# Patient Record
Sex: Male | Born: 1975
Health system: Southern US, Community
[De-identification: ages and names within clinical notes are randomized; demographics above are authoritative.]

## PROBLEM LIST (undated history)

## (undated) DIAGNOSIS — J069 Acute upper respiratory infection, unspecified: Secondary | ICD-10-CM

---
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
## 2007-04-23 ENCOUNTER — Ambulatory Visit (HOSPITAL_BASED_OUTPATIENT_CLINIC_OR_DEPARTMENT_OTHER): Admission: RE | Admit: 2007-04-23 | Discharge: 2007-04-23 | Payer: Self-pay | Admitting: Orthopedic Surgery

## 2007-06-28 ENCOUNTER — Emergency Department (HOSPITAL_COMMUNITY): Admission: EM | Admit: 2007-06-28 | Discharge: 2007-06-28 | Payer: Self-pay | Admitting: Emergency Medicine

## 2010-07-31 NOTE — Op Note (Signed)
Carlos Bush, Carlos Bush               ACCOUNT NO.:  [id number]   MEDICAL RECORD NO.:  [id number]          PATIENT TYPE:  AMB   LOCATION:  DSC                          FACILITY:  [facility]   PHYSICIAN:  Loreta Ave, M.D. DATE OF BIRTH:  05-06-1975   DATE OF PROCEDURE:  04/23/2007  DATE OF DISCHARGE:                               OPERATIVE REPORT   PREOPERATIVE DIAGNOSIS:  Grade 3 plus acromioclavicular separation,  right shoulder closed.   POSTOPERATIVE DIAGNOSES:  1. Grade 3 plus acromioclavicular separation, right shoulder closed.  2. 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   PROCEDURE:  Open repair of acromioclavicular dislocation with a fiber  weave coracoclavicular suture.  FiberWire primary repair of  coracoclavicular ligament.  Excision, distal clavicle.   SURGEON:  Mckinley Jewel, MD.   ASSISTANT:  Zonia Kief, PA, present throughout the entire case.   ANESTHESIA:  General.   BLOOD LOSS:  Minimal.   SPECIMENS:  None.   COMPLICATIONS:  None.   DRESSING:  Soft compressive with shoulder immobilizer.   PROCEDURE:  The patient brought to the operating room, placed on the  operating table in supine position.  After adequate anesthesia had been  obtained, prepped and draped in usual sterile fashion.  A saber incision  over the end of the clavicle going down to was coracoid.  Skin  subcutaneous tissue divided.  The distal clavicle had pulled out and  ripped the deltopectoral fascia with marked superior migration.  The  fascia was opened longitudinally from the [facility] joint over the did lateral  third of the clavicle and generous soft tissue flaps maintain on either  side.  End then the clavicle had marked traumatic change from the  separation.  Utilizing blunt and sharp dissection the, coracoclavicular  interval was exposed.  The coracoid exposed and then utilizing a right  angle clamp and then the Concept suture passing device, A Fiberweave  suture was  passed underneath the coracoid brought up.  I then placed a  mattress suture into coracoclavicular with FiberWire suture.  Lateral  centimeter of clavicle exposed and removed to prevent postoperative  symptoms of the [facility] joint.  A drill hole was then made at clavicle top to  bottom right over the coracoid.  The Fiberweave suture was placed  through drill hole.  The arm was then brought up to reduce the clavicle  to its normal position.  The Fiberweave suture was then firmly tied down  in place, restoring normal relationship between clavicle and coracoid.  The FiberWire suture, which brought the ligament fragments back  together, was then firmly tied to reapposed the coracoclavicular  ligament in good position for healing.  Both were cut.   Overall appearance and reduction confirmed visually and  fluoroscopically.  Wound irrigated.  I then closed the deltopectoral  fascia over top of the repair drill hole and reconstruction.  Wound  irrigated.  The skin closed with subcutaneous subcuticular Vicryl and  Steri-Strips.  Sterile compressive dressing was applied.  Shoulder  mobilizer applied.  Anesthesia reversed.  Brought to the recovery room.  Tolerated surgery  well without complication.      Loreta Ave, M.D.  Electronically Signed     DFM/MEDQ  D:  04/24/2007  T:  04/26/2007  Job:  [id number]

## 2011-03-15 ENCOUNTER — Encounter (INDEPENDENT_AMBULATORY_CARE_PROVIDER_SITE_OTHER): Payer: PRIVATE HEALTH INSURANCE | Admitting: Family Medicine

## 2011-03-15 DIAGNOSIS — Z0000 Encounter for general adult medical examination without abnormal findings: Secondary | ICD-10-CM

## 2013-01-12 ENCOUNTER — Ambulatory Visit (INDEPENDENT_AMBULATORY_CARE_PROVIDER_SITE_OTHER): Payer: Federal, State, Local not specified - PPO | Admitting: Emergency Medicine

## 2013-01-12 VITALS — BP 120/82 | HR 72 | Temp 98.9°F | Resp 18 | Ht 69.5 in | Wt 180.2 lb

## 2013-01-12 DIAGNOSIS — R51 Headache: Secondary | ICD-10-CM

## 2013-01-12 MED ORDER — MELOXICAM 15 MG PO TABS: 15.0000 mg | ORAL_TABLET | Freq: Every day | ORAL | Status: DC

## 2013-01-12 NOTE — Progress Notes (Signed)
  Subjective:    Patient ID: Carlos Bush, male    DOB: 01-30-1976, 37 y.o.   MRN: [id number]  HPI Has had headache for last week. Has had pressure behind his eyes and through head, also on top. Taking sudafed 12 hour and other daytime cold relief with little relief. Took sudafed and two advil this morning- some relief, not sure if meds or getting up and getting moving. Yesterday and today worsening pain. Wakes up during night with head pounding. Headache comes and goes Little runny nose, no congestion, no sore throat currently, last week had 1-2 days of sore throat. No ear pain. When flexing head forward, feels sharp pain down back into legs, lasts while head flexed. Gets better as day goes on. Fever to 99.9 and chills 6 days ago. No cough, no SOB, no CP. No visual changes, no light sensitivity. No nausea or vomiting.  Headache worse in morning and late afternoon. By 10 am, feels good, worse around 5 pm.  Review of Systems  Constitutional: Positive for fever and chills. Negative for activity change, appetite change and fatigue.  HENT: Positive for sinus pressure and sore throat. Negative for congestion, ear pain, postnasal drip and rhinorrhea.   Eyes: Negative for photophobia, pain and visual disturbance.  Respiratory: Negative for cough, chest tightness and shortness of breath.   Cardiovascular: Negative for chest pain.  Allergic/Immunologic: Negative for environmental allergies.  Neurological: Positive for headaches.       Objective:   Physical Exam  Nursing note and vitals reviewed. Constitutional: He is oriented to person, place, and time. He appears well-developed and well-nourished.  HENT:  Right Ear: Tympanic membrane, external ear and ear canal normal.  Left Ear: Tympanic membrane, external ear and ear canal normal.  Nose: Nose normal. No mucosal edema or rhinorrhea. Right sinus exhibits no maxillary sinus tenderness and no frontal sinus tenderness. Left sinus exhibits no  maxillary sinus tenderness and no frontal sinus tenderness.  Mouth/Throat: Oropharynx is clear and moist. No oropharyngeal exudate, posterior oropharyngeal edema, posterior oropharyngeal erythema or tonsillar abscesses.  Eyes: Conjunctivae are normal. Pupils are equal, round, and reactive to light. Right eye exhibits no discharge. Left eye exhibits no discharge.  Neck: Normal range of motion. Neck supple. No thyromegaly present.  Cardiovascular: Normal rate, regular rhythm and normal heart sounds.   Pulmonary/Chest: Effort normal and breath sounds normal.  Musculoskeletal: Normal range of motion. He exhibits no edema and no tenderness.  Lymphadenopathy:    He has no cervical adenopathy.  Neurological: He is alert and oriented to person, place, and time. He has normal reflexes. No cranial nerve deficit.  Skin: Skin is warm and dry.  Psychiatric: He has a normal mood and affect. His behavior is normal. Judgment and thought content normal.       Assessment & Plan:  Headache(784.0)  Meds ordered this encounter  Medications  . meloxicam (MOBIC) 15 MG tablet    Sig: Take 1 tablet (15 mg total) by mouth daily.    Dispense:  14 tablet    Refill:  0   1- Continue sudafed and add meloxicam 15 mg po qd. 2- RTC if worsening symptoms, visual changes, fever, vomiting or no improvement in 5-7 days.

## 2013-04-14 ENCOUNTER — Ambulatory Visit (INDEPENDENT_AMBULATORY_CARE_PROVIDER_SITE_OTHER): Payer: Federal, State, Local not specified - PPO | Admitting: Physician Assistant

## 2013-04-14 VITALS — BP 122/78 | HR 74 | Temp 98.0°F | Resp 17 | Ht 69.5 in | Wt 178.0 lb

## 2013-04-14 DIAGNOSIS — R059 Cough, unspecified: Secondary | ICD-10-CM

## 2013-04-14 DIAGNOSIS — J209 Acute bronchitis, unspecified: Secondary | ICD-10-CM

## 2013-04-14 DIAGNOSIS — R0602 Shortness of breath: Secondary | ICD-10-CM

## 2013-04-14 DIAGNOSIS — R05 Cough: Secondary | ICD-10-CM

## 2013-04-14 MED ORDER — METHYLPREDNISOLONE (PAK) 4 MG PO TABS: ORAL_TABLET | ORAL | Status: DC

## 2013-04-14 MED ORDER — HYDROCOD POLST-CHLORPHEN POLST 10-8 MG/5ML PO LQCR: 5.0000 mL | Freq: Two times a day (BID) | ORAL | Status: DC | PRN

## 2013-04-14 MED ORDER — AZITHROMYCIN 250 MG PO TABS: ORAL_TABLET | ORAL | Status: DC

## 2013-04-14 NOTE — Progress Notes (Signed)
   Subjective:    Patient ID: Carlos Bush, male    DOB: 07/04/1975, 38 y.o.   MRN: [id number]  HPI 38 year old male presents for evaluation of cough x 4 weeks. States symptoms started suddenly and continue to "linger on."  Admits to a 677 month old daughter and wife that are sick at home with similar symptoms. Daughter was diagnosed with RSV last week.  He does admit that he is feeling better but does continue to cough especially in the morning. It is productive of whitish/yellow mucous.    Denies fever, chills, nausea, vomiting, chest pain, headache, sore throat, wheezing, or otalgia. Does have slight nasal congestion and sinus drainage.    He has been taking OTC advil cold and flu which does seem to be helping.    Patient is otherwise healthy with no other concerns today.     Review of Systems  Constitutional: Negative for fever and chills.  HENT: Positive for congestion, postnasal drip and sinus pressure. Negative for ear pain and sore throat.   Respiratory: Positive for cough and shortness of breath. Negative for chest tightness and wheezing.   Gastrointestinal: Negative for nausea, vomiting and abdominal pain.  Neurological: Negative for dizziness and headaches.       Objective:   Physical Exam  Constitutional: He is oriented to person, place, and time. He appears well-developed and well-nourished.  HENT:  Head: Normocephalic and atraumatic.  Right Ear: Hearing, tympanic membrane, external ear and ear canal normal.  Left Ear: Hearing, tympanic membrane, external ear and ear canal normal.  Mouth/Throat: Uvula is midline, oropharynx is clear and moist and mucous membranes are normal.  Eyes: Conjunctivae are normal.  Neck: Normal range of motion. Neck supple.  Cardiovascular: Normal rate, regular rhythm and normal heart sounds.   Pulmonary/Chest: Effort normal and breath sounds normal.  Neurological: He is alert and oriented to person, place, and time.  Psychiatric: He has a  normal mood and affect. His behavior is normal. Judgment and thought content normal.          Assessment & Plan:  Acute bronchitis - Plan: azithromycin (ZITHROMAX) 250 MG tablet  Cough - Plan: chlorpheniramine-HYDROcodone (TUSSIONEX PENNKINETIC ER) 10-8 MG/5ML LQCR  Shortness of breath - Plan: methylPREDNIsolone (MEDROL DOSPACK) 4 MG tablet  Patient is afebrile and nontoxic appearing. Due to length of illness will cover with Zpack and medrol dose pack. Tussionex qhs prn cough. Follow up if symptoms worsen or fail to improve.

## 2013-09-13 ENCOUNTER — Ambulatory Visit (INDEPENDENT_AMBULATORY_CARE_PROVIDER_SITE_OTHER): Payer: Federal, State, Local not specified - PPO | Admitting: Internal Medicine

## 2013-09-13 VITALS — BP 116/76 | HR 56 | Temp 97.8°F | Resp 16 | Ht 69.5 in | Wt 180.4 lb

## 2013-09-13 DIAGNOSIS — S0501XA Injury of conjunctiva and corneal abrasion without foreign body, right eye, initial encounter: Secondary | ICD-10-CM

## 2013-09-13 DIAGNOSIS — H00019 Hordeolum externum unspecified eye, unspecified eyelid: Secondary | ICD-10-CM

## 2013-09-13 DIAGNOSIS — H5213 Myopia, bilateral: Secondary | ICD-10-CM

## 2013-09-13 DIAGNOSIS — H00013 Hordeolum externum right eye, unspecified eyelid: Secondary | ICD-10-CM

## 2013-09-13 DIAGNOSIS — S058X9A Other injuries of unspecified eye and orbit, initial encounter: Secondary | ICD-10-CM

## 2013-09-13 DIAGNOSIS — H5210 Myopia, unspecified eye: Secondary | ICD-10-CM

## 2013-09-13 MED ORDER — OFLOXACIN 0.3 % OP SOLN: OPHTHALMIC | Status: DC

## 2013-09-13 NOTE — Progress Notes (Signed)
   Subjective:    Patient ID: Carlos Bush, male    DOB: 11/06/1975, 38 y.o.   MRN: [id number] This chart was scribed for Ellamae Siaobert Doolittle, MD by Evon Slackerrance Branch, ED Scribe. This Patient was seen in room 10 and the patients care was started at 8:10 PM  HPI Carlos Bush is a 38 y.o. male Present with right eye itching onset this morning while taking a shower. He states he has associated eye drainage, and photophobia. He states it feels like there is something in his eye that he cant get out. He states that he does wear contacts. He denies having any visual disturbance or other related symptoms.   No ongoing problems/no medications Review of Systems  Eyes: Positive for photophobia, discharge and itching. Negative for visual disturbance.   he has no rheumatic illnesses    Objective:    Physical Exam  Nursing note and vitals reviewed. Constitutional: He is oriented to person, place, and time. He appears well-developed and well-nourished. No distress.  HENT:  Head: Normocephalic and atraumatic.  Nose: Nose normal.  Mouth/Throat: Oropharynx is clear and moist.  Eyes: Conjunctivae and EOM are normal. Pupils are equal, round, and reactive to light. Right eye exhibits discharge.    This blushing is in the distribution of the contact. The upper lid was puffy and slightly tender just lateral to the midline without a distinct nodule. This lid is everted and reveals a pus discharge there is slightly particulate in the area of his tenderness and this is swabbed and removed. There is no lid cellulitis and no corneal ulcer.  Neck: Neck supple.  Cardiovascular: Normal rate.   Pulmonary/Chest: Effort normal.  Lymphadenopathy:    He has no cervical adenopathy.  Neurological: He is alert and oriented to person, place, and time.    Assessment & Plan:  Cornea abrasion, right, initial encounter  Stye, right?--resolving  Myopia, bilateral w/ contacts  Meds ordered this encounter  Medications   . ofloxacin (OCUFLOX) 0.3 % ophthalmic solution    Sig: 2 qtts R Eye q 2h til bedtime then q4h while awake for next 4 days    Dispense:  5 mL    Refill:  1   If not improved in 24-48 hours we'll need slit lamp exam Avoid contacts until well     I have completed the patient encounter in its entirety as documented by the scribe, with editing by me where necessary. Robert P. Merla Richesoolittle, M.D.

## 2013-09-14 DIAGNOSIS — H5210 Myopia, unspecified eye: Secondary | ICD-10-CM | POA: Insufficient documentation

## 2013-09-28 ENCOUNTER — Ambulatory Visit (INDEPENDENT_AMBULATORY_CARE_PROVIDER_SITE_OTHER): Payer: Federal, State, Local not specified - PPO | Admitting: Family Medicine

## 2013-09-28 VITALS — BP 110/68 | HR 62 | Temp 97.9°F | Resp 16 | Ht 69.75 in | Wt 180.0 lb

## 2013-09-28 DIAGNOSIS — R591 Generalized enlarged lymph nodes: Secondary | ICD-10-CM

## 2013-09-28 DIAGNOSIS — J029 Acute pharyngitis, unspecified: Secondary | ICD-10-CM

## 2013-09-28 DIAGNOSIS — R599 Enlarged lymph nodes, unspecified: Secondary | ICD-10-CM

## 2013-09-28 LAB — POCT RAPID STREP A (OFFICE): RAPID STREP A SCREEN: NEGATIVE

## 2013-09-28 MED ORDER — AMOXICILLIN 500 MG PO CAPS: 500.0000 mg | ORAL_CAPSULE | Freq: Three times a day (TID) | ORAL | Status: DC

## 2013-09-28 NOTE — Patient Instructions (Addendum)
Tylenol or ibuprofen as needed for sore throat. Other info as below. If throat culture negative, we have the option of discontinuing the antibiotic at that time.  We will let you know those results in next 4-5 days. Return to the clinic or go to the nearest emergency room if any of your symptoms worsen or new symptoms occur.   Sore Throat A sore throat is pain, burning, irritation, or scratchiness of the throat. There is often pain or tenderness when swallowing or talking. A sore throat may be accompanied by other symptoms, such as coughing, sneezing, fever, and swollen neck glands. A sore throat is often the first sign of another sickness, such as a cold, flu, strep throat, or mononucleosis (commonly known as mono). Most sore throats go away without medical treatment. CAUSES  The most common causes of a sore throat include:  A viral infection, such as a cold, flu, or mono.  A bacterial infection, such as strep throat, tonsillitis, or whooping cough.  Seasonal allergies.  Dryness in the air.  Irritants, such as smoke or pollution.  Gastroesophageal reflux disease (GERD). HOME CARE INSTRUCTIONS   Only take over-the-counter medicines as directed by your caregiver.  Drink enough fluids to keep your urine clear or pale yellow.  Rest as needed.  Try using throat sprays, lozenges, or sucking on hard candy to ease any pain (if older than 4 years or as directed).  Sip warm liquids, such as broth, herbal tea, or warm water with honey to relieve pain temporarily. You may also eat or drink cold or frozen liquids such as frozen ice pops.  Gargle with salt water (mix 1 tsp salt with 8 oz of water).  Do not smoke and avoid secondhand smoke.  Put a cool-mist humidifier in your bedroom at night to moisten the air. You can also turn on a hot shower and sit in the bathroom with the door closed for 5-10 minutes. SEEK IMMEDIATE MEDICAL CARE IF:  You have difficulty breathing.  You are unable to  swallow fluids, soft foods, or your saliva.  You have increased swelling in the throat.  Your sore throat does not get better in 7 days.  You have nausea and vomiting.  You have a fever or persistent symptoms for more than 2-3 days.  You have a fever and your symptoms suddenly get worse. MAKE SURE YOU:   Understand these instructions.  Will watch your condition.  Will get help right away if you are not doing well or get worse. Document Released: 04/11/2004 Document Revised: 02/19/2012 Document Reviewed: 11/10/2011 [facility] Patient Information 2015 [place]ExitCare, [place]. This information is not intended to replace advice given to you by your health care provider. Make sure you discuss any questions you have with your health care provider.

## 2013-09-28 NOTE — Progress Notes (Addendum)
Subjective:  This chart was scribed for Carlos Staggers, MD, by Carlos Bush, Medical Scribe. This  patient's care was started at 9:25 AM.  Authored by Carlos Bush. MD - unable to change in [facility].   Patient ID: [name], male    DOB: 05-12-1975, 38 y.o.   MRN: [id number]  Sore Throat  Associated symptoms include congestion.  Sinus Problem Associated symptoms include congestion, sinus pressure and a sore throat.    [name] is a 38 y.o. male  PCP: No PCP Per Patient  The pt reports a gradually-worsening sore throat which began three days ago. He also reports swollen lymph nodes and a week and a half of sinus congestion, drainage, and pressure. [name] states the sore throat does not seem associated with sinus symptoms. He denies a fever or appetite changes. He has used Nyquil and Advil Cold and Sinus without resolution. His daughter experienced one day of emesis and fever which resolved spontaneously.  He denies known medication allergies.   Patient Active Problem List   Diagnosis Date Noted   Myopia-contacts 09/14/2013   History reviewed. No pertinent past medical history.  History reviewed. No pertinent past surgical history.  No Known Allergies  Prior to Admission medications   Not on File    Review of Systems  Constitutional: Negative for fever and activity change.  HENT: Positive for congestion, rhinorrhea, sinus pressure and sore throat.        Objective:   Physical Exam  Nursing note and vitals reviewed. Constitutional: He is oriented to person, place, and time. He appears well-developed and well-nourished. No distress.  HENT:  Head: Normocephalic and atraumatic.  Right Ear: Tympanic membrane, external ear and ear canal normal.  Left Ear: Tympanic membrane, external ear and ear canal normal.  Nose: No rhinorrhea.  Mouth/Throat: Mucous membranes are normal. Posterior oropharyngeal erythema present. No oropharyngeal exudate or tonsillar abscesses.   Erythema diffusely to oropharynx.   Eyes: Conjunctivae and EOM are normal. Pupils are equal, round, and reactive to light.  Neck: Neck supple. No tracheal deviation present.  Cardiovascular: Normal rate, regular rhythm, normal heart sounds and intact distal pulses.   No murmur heard. Pulmonary/Chest: Effort normal and breath sounds normal. No respiratory distress. He has no wheezes. He has no rhonchi. He has no rales.  Abdominal: Soft. There is no tenderness.  Musculoskeletal: Normal range of motion.  Lymphadenopathy:    He has no cervical adenopathy.  Right greater than left, slightly enlarged AC node tender to palpation.   Neurological: He is alert and oriented to person, place, and time.  Skin: Skin is warm and dry. No rash noted.  Psychiatric: He has a normal mood and affect. His behavior is normal.    Filed Vitals:   09/28/13 0857  BP: 110/68  Pulse: 62  Temp: 97.9 F (36.6 C)  TempSrc: Oral  Resp: 16  Height: 5' 9.75" (1.772 m)  Weight: 180 lb (81.647 kg)  SpO2: 100%   Results for orders placed in visit on 09/28/13  POCT RAPID STREP A (OFFICE)      Result Value Ref Range   Rapid Strep A Screen Negative  Negative      Assessment & Plan:   Carlos Bush is a 38 y.o. male Sore throat - Plan: POCT rapid strep A, Culture, Group A Strep, amoxicillin (AMOXIL) 500 MG capsule  Lymphadenopathy - Plan: amoxicillin (AMOXIL) 500 MG capsule  Prior URI improving, suspected secondary infection/new cause of sore throat,  and with LAD and appearance - possible false negative RS.  Start amox, check cx - option to d/c abx if negative, and sx care. rtc precautions.   Meds ordered this encounter  Medications   amoxicillin (AMOXIL) 500 MG capsule    Sig: Take 1 capsule (500 mg total) by mouth 3 (three) times daily.    Dispense:  30 capsule    Refill:  0   Patient Instructions  Tylenol or ibuprofen as needed for sore throat. Other info as below. If throat culture negative, we  have the option of discontinuing the antibiotic at that time.  We will let you know those results in next 4-5 days. Return to the clinic or go to the nearest emergency room if any of your symptoms worsen or new symptoms occur.   Sore Throat A sore throat is pain, burning, irritation, or scratchiness of the throat. There is often pain or tenderness when swallowing or talking. A sore throat may be accompanied by other symptoms, such as coughing, sneezing, fever, and swollen neck glands. A sore throat is often the first sign of another sickness, such as a cold, flu, strep throat, or mononucleosis (commonly known as mono). Most sore throats go away without medical treatment. CAUSES  The most common causes of a sore throat include:  A viral infection, such as a cold, flu, or mono.  A bacterial infection, such as strep throat, tonsillitis, or whooping cough.  Seasonal allergies.  Dryness in the air.  Irritants, such as smoke or pollution.  Gastroesophageal reflux disease (GERD). HOME CARE INSTRUCTIONS   Only take over-the-counter medicines as directed by your caregiver.  Drink enough fluids to keep your urine clear or pale yellow.  Rest as needed.  Try using throat sprays, lozenges, or sucking on hard candy to ease any pain (if older than 4 years or as directed).  Sip warm liquids, such as broth, herbal tea, or warm water with honey to relieve pain temporarily. You may also eat or drink cold or frozen liquids such as frozen ice pops.  Gargle with salt water (mix 1 tsp salt with 8 oz of water).  Do not smoke and avoid secondhand smoke.  Put a cool-mist humidifier in your bedroom at night to moisten the air. You can also turn on a hot shower and sit in the bathroom with the door closed for 5-10 minutes. SEEK IMMEDIATE MEDICAL CARE IF:  You have difficulty breathing.  You are unable to swallow fluids, soft foods, or your saliva.  You have increased swelling in the throat.  Your  sore throat does not get better in 7 days.  You have nausea and vomiting.  You have a fever or persistent symptoms for more than 2-3 days.  You have a fever and your symptoms suddenly get worse. MAKE SURE YOU:   Understand these instructions.  Will watch your condition.  Will get help right away if you are not doing well or get worse. Document Released: 04/11/2004 Document Revised: 02/19/2012 Document Reviewed: 11/10/2011 [facility] LPExitCare Patient Information 2015 [place]ExitCare, [place]LLC. This information is not intended to replace advice given to you by your health care provider. Make sure you discuss any questions you have with your health care provider.       I personally performed the services described in this documentation, which was scribed in my presence. The recorded information has been reviewed and considered, and addended by me as needed.

## 2013-09-29 LAB — CULTURE, GROUP A STREP

## 2014-05-21 ENCOUNTER — Ambulatory Visit (INDEPENDENT_AMBULATORY_CARE_PROVIDER_SITE_OTHER): Payer: Federal, State, Local not specified - PPO | Admitting: Physician Assistant

## 2014-05-21 VITALS — BP 114/66 | HR 77 | Temp 98.6°F | Resp 19 | Ht 69.5 in | Wt 178.6 lb

## 2014-05-21 DIAGNOSIS — J069 Acute upper respiratory infection, unspecified: Secondary | ICD-10-CM | POA: Diagnosis not present

## 2014-05-21 MED ORDER — BENZONATATE 100 MG PO CAPS: 100.0000 mg | ORAL_CAPSULE | Freq: Three times a day (TID) | ORAL | Status: DC | PRN

## 2014-05-21 MED ORDER — HYDROCOD POLST-CHLORPHEN POLST 10-8 MG/5ML PO LQCR: 5.0000 mL | Freq: Two times a day (BID) | ORAL | Status: DC | PRN

## 2014-05-21 NOTE — Progress Notes (Signed)
   Subjective:    Patient ID: Carlos Bush, male    DOB: 08/30/1975, 39 y.o.   MRN: [id number]  HPI Patient presents for 2 weeks of cough and congestion. Initial sx were productive cough, fatigue, rhinorrhea, sore throat, and headache which have improved. Cough, however, has continue and coughing spells make it difficult to breath. Denies sick contacts. No h/o asthma or allergies. Has tried alkaseltzer cold and mucinex with some relief.   Review of Systems  Constitutional: Negative for fever, chills, diaphoresis and fatigue.  HENT: Positive for congestion and rhinorrhea. Negative for ear discharge, ear pain, postnasal drip, sinus pressure and sore throat.   Respiratory: Positive for cough. Negative for shortness of breath and wheezing.   Cardiovascular: Negative for chest pain.  Gastrointestinal: Negative for nausea and vomiting.  Neurological: Negative for dizziness and headaches.       Objective:   Physical Exam  Constitutional: He is oriented to person, place, and time. He appears well-developed and well-nourished. No distress.  Blood pressure 114/66, pulse 77, temperature 98.6 F (37 C), temperature source Oral, resp. rate 19, height 5' 9.5" (1.765 m), weight 178 lb 9.6 oz (81.012 kg), SpO2 98 %.  HENT:  Head: Normocephalic and atraumatic.  Right Ear: Tympanic membrane, external ear and ear canal normal.  Left Ear: Tympanic membrane, external ear and ear canal normal.  Nose: Rhinorrhea present. No mucosal edema. Right sinus exhibits no maxillary sinus tenderness and no frontal sinus tenderness. Left sinus exhibits no maxillary sinus tenderness and no frontal sinus tenderness.  Mouth/Throat: Uvula is midline and mucous membranes are normal. Posterior oropharyngeal erythema (marginal) present. No oropharyngeal exudate, posterior oropharyngeal edema or tonsillar abscesses.  Eyes: Conjunctivae are normal. Pupils are equal, round, and reactive to light. Right eye exhibits no discharge.  Left eye exhibits no discharge. No scleral icterus.  Neck: Normal range of motion. Neck supple.  Cardiovascular: Normal rate, regular rhythm and normal heart sounds.  Exam reveals no gallop and no friction rub.   No murmur heard. Pulmonary/Chest: Effort normal and breath sounds normal. No respiratory distress. He has no decreased breath sounds. He has no wheezes. He has no rhonchi. He has no rales.  Lymphadenopathy:    He has no cervical adenopathy.  Neurological: He is alert and oriented to person, place, and time.  Skin: Skin is warm and dry. No rash noted. He is not diaphoretic. No erythema. No pallor.       Assessment & Plan:  1. Acute upper respiratory infection Plenty of fluid and rest. If no improvement by middle next week should call back and will send Zpack. - chlorpheniramine-HYDROcodone (TUSSIONEX PENNKINETIC ER) 10-8 MG/5ML LQCR; Take 5 mLs by mouth every 12 (twelve) hours as needed for cough (cough).  Dispense: 60 mL; Refill: 0 - benzonatate (TESSALON) 100 MG capsule; Take 1-2 capsules (100-200 mg total) by mouth 3 (three) times daily as needed for cough.  Dispense: 40 capsule; Refill: 0   Tysheem Accardo PA-C  [facility] 05/21/2014 3:06 PM

## 2014-05-21 NOTE — Patient Instructions (Signed)
Upper Respiratory Infection, Adult An upper respiratory infection (URI) is also sometimes known as the common cold. The upper respiratory tract includes the nose, sinuses, throat, trachea, and bronchi. Bronchi are the airways leading to the lungs. Most people improve within 1 week, but symptoms can last up to 2 weeks. A residual cough may last even longer.  CAUSES Many different viruses can infect the tissues lining the upper respiratory tract. The tissues become irritated and inflamed and often become very moist. Mucus production is also common. A cold is contagious. You can easily spread the virus to others by oral contact. This includes kissing, sharing a glass, coughing, or sneezing. Touching your mouth or nose and then touching a surface, which is then touched by another person, can also spread the virus. SYMPTOMS  Symptoms typically develop 1 to 3 days after you come in contact with a cold virus. Symptoms vary from person to person. They may include:  Runny nose.  Sneezing.  Nasal congestion.  Sinus irritation.  Sore throat.  Loss of voice (laryngitis).  Cough.  Fatigue.  Muscle aches.  Loss of appetite.  Headache.  Low-grade fever. DIAGNOSIS  You might diagnose your own cold based on familiar symptoms, since most people get a cold 2 to 3 times a year. Your caregiver can confirm this based on your exam. Most importantly, your caregiver can check that your symptoms are not due to another disease such as strep throat, sinusitis, pneumonia, asthma, or epiglottitis. Blood tests, throat tests, and X-rays are not necessary to diagnose a common cold, but they may sometimes be helpful in excluding other more serious diseases. Your caregiver will decide if any further tests are required. RISKS AND COMPLICATIONS  You may be at risk for a more severe case of the common cold if you smoke cigarettes, have chronic heart disease (such as heart failure) or lung disease (such as asthma), or if  you have a weakened immune system. The very young and very old are also at risk for more serious infections. Bacterial sinusitis, middle ear infections, and bacterial pneumonia can complicate the common cold. The common cold can worsen asthma and chronic obstructive pulmonary disease (COPD). Sometimes, these complications can require emergency medical care and may be life-threatening. PREVENTION  The best way to protect against getting a cold is to practice good hygiene. Avoid oral or hand contact with people with cold symptoms. Wash your hands often if contact occurs. There is no clear evidence that vitamin C, vitamin E, echinacea, or exercise reduces the chance of developing a cold. However, it is always recommended to get plenty of rest and practice good nutrition. TREATMENT  Treatment is directed at relieving symptoms. There is no cure. Antibiotics are not effective, because the infection is caused by a virus, not by bacteria. Treatment may include:  Increased fluid intake. Sports drinks offer valuable electrolytes, sugars, and fluids.  Breathing heated mist or steam (vaporizer or shower).  Eating chicken soup or other clear broths, and maintaining good nutrition.  Getting plenty of rest.  Using gargles or lozenges for comfort.  Controlling fevers with ibuprofen or acetaminophen as directed by your caregiver.  Increasing usage of your inhaler if you have asthma. Zinc gel and zinc lozenges, taken in the first 24 hours of the common cold, can shorten the duration and lessen the severity of symptoms. Pain medicines may help with fever, muscle aches, and throat pain. A variety of non-prescription medicines are available to treat congestion and runny nose. Your caregiver   can make recommendations and may suggest nasal or lung inhalers for other symptoms.  HOME CARE INSTRUCTIONS   Only take over-the-counter or prescription medicines for pain, discomfort, or fever as directed by your  caregiver.  Use a warm mist humidifier or inhale steam from a shower to increase air moisture. This may keep secretions moist and make it easier to breathe.  Drink enough water and fluids to keep your urine clear or pale yellow.  Rest as needed.  Return to work when your temperature has returned to normal or as your caregiver advises. You may need to stay home longer to avoid infecting others. You can also use a face mask and careful hand washing to prevent spread of the virus. SEEK MEDICAL CARE IF:   After the first few days, you feel you are getting worse rather than better.  You need your caregiver's advice about medicines to control symptoms.  You develop chills, worsening shortness of breath, or brown or red sputum. These may be signs of pneumonia.  You develop yellow or brown nasal discharge or pain in the face, especially when you bend forward. These may be signs of sinusitis.  You develop a fever, swollen neck glands, pain with swallowing, or white areas in the back of your throat. These may be signs of strep throat. SEEK IMMEDIATE MEDICAL CARE IF:   You have a fever.  You develop severe or persistent headache, ear pain, sinus pain, or chest pain.  You develop wheezing, a prolonged cough, cough up blood, or have a change in your usual mucus (if you have chronic lung disease).  You develop sore muscles or a stiff neck. Document Released: 08/28/2000 Document Revised: 05/27/2011 Document Reviewed: 06/09/2013 Exit[employer] Patient Information 2015 [employer]. This information is not intended to replace advice given to you by your health care provider. Make sure you discuss any questions you have with your health care provider.  

## 2014-07-14 ENCOUNTER — Ambulatory Visit (INDEPENDENT_AMBULATORY_CARE_PROVIDER_SITE_OTHER): Payer: Federal, State, Local not specified - PPO | Admitting: Physician Assistant

## 2014-07-14 VITALS — BP 110/78 | HR 88 | Temp 97.7°F | Resp 20 | Ht 69.5 in | Wt 177.6 lb

## 2014-07-14 DIAGNOSIS — J069 Acute upper respiratory infection, unspecified: Secondary | ICD-10-CM | POA: Diagnosis not present

## 2014-07-14 DIAGNOSIS — T485X1A Poisoning by other anti-common-cold drugs, accidental (unintentional), initial encounter: Secondary | ICD-10-CM

## 2014-07-14 DIAGNOSIS — J310 Chronic rhinitis: Secondary | ICD-10-CM

## 2014-07-14 DIAGNOSIS — T485X5A Adverse effect of other anti-common-cold drugs, initial encounter: Secondary | ICD-10-CM

## 2014-07-14 MED ORDER — NAPROXEN 500 MG PO TABS: 500.0000 mg | ORAL_TABLET | Freq: Two times a day (BID) | ORAL | Status: DC

## 2014-07-14 MED ORDER — HYDROCODONE-HOMATROPINE 5-1.5 MG/5ML PO SYRP: 5.0000 mL | ORAL_SOLUTION | Freq: Three times a day (TID) | ORAL | Status: DC | PRN

## 2014-07-14 MED ORDER — LORATADINE-PSEUDOEPHEDRINE ER 10-240 MG PO TB24: 1.0000 | ORAL_TABLET | Freq: Every day | ORAL | Status: DC

## 2014-07-14 NOTE — Progress Notes (Signed)
07/14/2014 at 6:58 PM  Carlos Bush / DOB: 02/21/1976 / MRN: [id number]  The patient has Myopia-contacts on his problem list.  SUBJECTIVE  Chief complaint: Sinus Problem; Nasal Congestion; Headache; Fatigue; Chills; and Generalized Body Aches  Carlos Bush is a 39 y.o. male complaining of nasal blockage, post nasal drip, sinus and nasal congestion and sore throat that started 6 days ago.  Associated symptoms include headache today, and he denies fever, cough, difficulty breathing and jaw pain.The patient symptoms are worsening. Treatments tried thus far include Afrin, starting bid four days ago with good  relief initally. He has also tried Zyrtec D and ibuprofen both of which help a good deal.  He reports sick contacts.   He  has no past medical history on file.    Medications reviewed and updated by myself where necessary, and exist elsewhere in the encounter.   Carlos Bush has No Known Allergies. He  reports that he has never smoked. He does not have any smokeless tobacco history on file. He reports that he does not drink alcohol or use illicit drugs. He  reports that he currently engages in sexual activity. The patient  has no past surgical history on file.  His family history includes Cirrhosis in his father.  ROS  Per HPI  OBJECTIVE  His  height is 5' 9.5" (1.765 m) and weight is 177 lb 9.6 oz (80.559 kg). His oral temperature is 97.7 F (36.5 C). His blood pressure is 110/78 and his pulse is 88. His respiration is 20 and oxygen saturation is 98%.  The patient's body mass index is 25.86 kg/(m^2).  Physical Exam  Constitutional: He is oriented to person, place, and time. He appears well-developed and well-nourished. No distress.  HENT:  Right Ear: Hearing, tympanic membrane, external ear and ear canal normal.  Left Ear: Hearing, tympanic membrane, external ear and ear canal normal.  Nose: Mucosal edema present. No sinus tenderness. Right sinus exhibits no maxillary sinus  tenderness and no frontal sinus tenderness. Left sinus exhibits no maxillary sinus tenderness and no frontal sinus tenderness.  Mouth/Throat: Uvula is midline and mucous membranes are normal. Mucous membranes are not pale, not dry and not cyanotic. Posterior oropharyngeal erythema present. No oropharyngeal exudate, posterior oropharyngeal edema or tonsillar abscesses.  Cardiovascular: Normal rate and regular rhythm.   Respiratory: Effort normal and breath sounds normal. He has no wheezes. He has no rales.  Musculoskeletal: Normal range of motion.  Lymphadenopathy:    He has no cervical adenopathy.  Neurological: He is alert and oriented to person, place, and time.  Skin: Skin is warm and dry. He is not diaphoretic.  Psychiatric: He has a normal mood and affect.    No results found for this or any previous visit (from the past 24 hour(s)).  ASSESSMENT & PLAN  Gerri SporeWesley was seen today for sinus problem, nasal congestion, headache, fatigue, chills and generalized body aches.  Diagnoses and all orders for this visit:  Viral URI Orders: -     loratadine-pseudoephedrine (CLARITIN-D 24 HOUR) 10-240 MG per 24 hr tablet; Take 1 tablet by mouth daily. -     naproxen (NAPROSYN) 500 MG tablet; Take 1 tablet (500 mg total) by mouth 2 (two) times daily with a meal. -     HYDROcodone-homatropine (HYCODAN) 5-1.5 MG/5ML syrup; Take 5 mLs by mouth every 8 (eight) hours as needed for cough.  Rhinitis medicamentosa: Advised patient to stop afrin.     The patient was advised to  call or come back to clinic if he does not see an improvement in symptoms, or worsens with the above plan.   Deliah Boston, MHS, PA-C [facility] 07/14/2014 6:58 PM

## 2014-07-16 NOTE — Progress Notes (Signed)
  Medical screening examination/treatment/procedure(s) were performed by non-physician practitioner and as supervising physician I was immediately available for consultation/collaboration.     

## 2014-11-14 ENCOUNTER — Ambulatory Visit (INDEPENDENT_AMBULATORY_CARE_PROVIDER_SITE_OTHER): Payer: Federal, State, Local not specified - PPO | Admitting: Emergency Medicine

## 2014-11-14 VITALS — BP 108/72 | HR 67 | Temp 98.0°F | Resp 17 | Ht 69.5 in | Wt 177.0 lb

## 2014-11-14 DIAGNOSIS — J209 Acute bronchitis, unspecified: Secondary | ICD-10-CM | POA: Diagnosis not present

## 2014-11-14 MED ORDER — PSEUDOEPHEDRINE-GUAIFENESIN ER 60-600 MG PO TB12: 1.0000 | ORAL_TABLET | Freq: Two times a day (BID) | ORAL | Status: DC

## 2014-11-14 MED ORDER — AMOXICILLIN-POT CLAVULANATE 875-125 MG PO TABS: 1.0000 | ORAL_TABLET | Freq: Two times a day (BID) | ORAL | Status: DC

## 2014-11-14 MED ORDER — HYDROCOD POLST-CPM POLST ER 10-8 MG/5ML PO SUER: 5.0000 mL | Freq: Two times a day (BID) | ORAL | Status: DC

## 2014-11-14 NOTE — Progress Notes (Signed)
Subjective:  Patient ID: Carlos Bush, male    DOB: [birth year]  Age: 39 y.o. MRN: [id number]  CC: URI and Sinusitis   HPI [name] presents   With nasal congestion postnasal drainage that is mucoid in color.. He has a cough that is productive of mucopurulent sputum.Marland Kitchen He has no wheezing or shortness of breath. He has no fever or chills. No nausea or vomiting. He says he feels. " miserable." he's had no improvement with over-the-counter medication.  History Andrick has no past medical history on file.   He has no past surgical history on file.   His  family history includes Cirrhosis in his father.  He   reports that he has never smoked. He does not have any smokeless tobacco history on file. He reports that he does not drink alcohol or use illicit drugs.  Outpatient Prescriptions Prior to Visit  Medication Sig Dispense Refill  . loratadine-pseudoephedrine (CLARITIN-D 24 HOUR) 10-240 MG per 24 hr tablet Take 1 tablet by mouth daily. (Patient not taking: Reported on 11/14/2014) 14 tablet 0  . HYDROcodone-homatropine (HYCODAN) 5-1.5 MG/5ML syrup Take 5 mLs by mouth every 8 (eight) hours as needed for cough. 120 mL 0  . naproxen (NAPROSYN) 500 MG tablet Take 1 tablet (500 mg total) by mouth 2 (two) times daily with a meal. (Patient not taking: Reported on 11/14/2014) 30 tablet 0   No facility-administered medications prior to visit.    Social History   Social History  . Marital Status: Single    Spouse Name: N/A  . Number of Children: N/A  . Years of Education: N/A   Social History Main Topics  . Smoking status: Never Smoker   . Smokeless tobacco: None  . Alcohol Use: No  . Drug Use: No  . Sexual Activity: Yes   Other Topics Concern  . None   Social History Narrative     Review of Systems  Constitutional: Negative for fever, chills and appetite change.  HENT: Negative for congestion, ear pain, postnasal drip, sinus pressure and sore throat.   Eyes: Negative  for pain and redness.  Respiratory: Negative for cough, shortness of breath and wheezing.   Cardiovascular: Negative for leg swelling.  Gastrointestinal: Negative for nausea, vomiting, abdominal pain, diarrhea, constipation and blood in stool.  Endocrine: Negative for polyuria.  Genitourinary: Negative for dysuria, urgency, frequency and flank pain.  Musculoskeletal: Negative for gait problem.  Skin: Negative for rash.  Neurological: Negative for weakness and headaches.  Psychiatric/Behavioral: Negative for confusion and decreased concentration. The patient is not nervous/anxious.     Objective:  BP 108/72 mmHg  Pulse 67  Temp(Src) 98 F (36.7 C) (Oral)  Resp 17  Ht 5' 9.5" (1.765 m)  Wt 177 lb (80.287 kg)  BMI 25.77 kg/m2  SpO2 98%  Physical Exam  Constitutional: He is oriented to person, place, and time. He appears well-developed and well-nourished. No distress.  HENT:  Head: Normocephalic and atraumatic.  Right Ear: External ear normal.  Left Ear: External ear normal.  Nose: Nose normal.  Eyes: Conjunctivae and EOM are normal. Pupils are equal, round, and reactive to light. No scleral icterus.  Neck: Normal range of motion. Neck supple. No tracheal deviation present.  Cardiovascular: Normal rate, regular rhythm and normal heart sounds.   Pulmonary/Chest: Effort normal. No respiratory distress. He has no wheezes. He has no rales.  Abdominal: He exhibits no mass. There is no tenderness. There is no rebound and no guarding.  Musculoskeletal: He exhibits no edema.  Lymphadenopathy:    He has no cervical adenopathy.  Neurological: He is alert and oriented to person, place, and time. Coordination normal.  Skin: Skin is warm and dry. No rash noted.  Psychiatric: He has a normal mood and affect. His behavior is normal.      Assessment & Plan:   Viliami was seen today for uri and sinusitis.  Diagnoses and all orders for this visit:  Acute bronchitis, unspecified  organism  Other orders -     amoxicillin-clavulanate (AUGMENTIN) 875-125 MG per tablet; Take 1 tablet by mouth 2 (two) times daily. -     chlorpheniramine-HYDROcodone (TUSSIONEX PENNKINETIC ER) 10-8 MG/5ML SUER; Take 5 mLs by mouth 2 (two) times daily. -     pseudoephedrine-guaifenesin (MUCINEX D) 60-600 MG per tablet; Take 1 tablet by mouth every 12 (twelve) hours.  I have discontinued Mr. Hayward naproxen and HYDROcodone-homatropine. I am also having him start on amoxicillin-clavulanate, chlorpheniramine-HYDROcodone, and pseudoephedrine-guaifenesin. Additionally, I am having him maintain his loratadine-pseudoephedrine.  Meds ordered this encounter  Medications  . amoxicillin-clavulanate (AUGMENTIN) 875-125 MG per tablet    Sig: Take 1 tablet by mouth 2 (two) times daily.    Dispense:  20 tablet    Refill:  0  . chlorpheniramine-HYDROcodone (TUSSIONEX PENNKINETIC ER) 10-8 MG/5ML SUER    Sig: Take 5 mLs by mouth 2 (two) times daily.    Dispense:  60 mL    Refill:  0  . pseudoephedrine-guaifenesin (MUCINEX D) 60-600 MG per tablet    Sig: Take 1 tablet by mouth every 12 (twelve) hours.    Dispense:  18 tablet    Refill:  0    Appropriate red flag conditions were discussed with the patient as well as actions that should be taken.  Patient expressed his understanding.  Follow-up: Return if symptoms worsen or fail to improve.  Carmelina Dane, MD

## 2014-11-14 NOTE — Patient Instructions (Signed)

## 2015-06-08 ENCOUNTER — Ambulatory Visit (INDEPENDENT_AMBULATORY_CARE_PROVIDER_SITE_OTHER): Payer: Federal, State, Local not specified - PPO | Admitting: Family Medicine

## 2015-06-08 VITALS — BP 132/88 | HR 81 | Temp 98.6°F | Resp 16 | Ht 70.0 in | Wt 189.0 lb

## 2015-06-08 DIAGNOSIS — R05 Cough: Secondary | ICD-10-CM | POA: Diagnosis not present

## 2015-06-08 DIAGNOSIS — R059 Cough, unspecified: Secondary | ICD-10-CM

## 2015-06-08 DIAGNOSIS — J069 Acute upper respiratory infection, unspecified: Secondary | ICD-10-CM

## 2015-06-08 MED ORDER — HYDROCOD POLST-CPM POLST ER 10-8 MG/5ML PO SUER: 5.0000 mL | Freq: Two times a day (BID) | ORAL | Status: DC

## 2015-06-08 NOTE — Progress Notes (Signed)
   Subjective:    Patient ID: Carlos DeutscherWesley L Parke, male    DOB: 11/20/1975, 40 y.o.   MRN: [id number]  HPI This is a 40 yo male who presents today with 5 days of fever, chills, sore throat, some cough with green-orange phlegm. Had a sinus cold two weeks ago that resolved. No congestion or sinus pressure. No SOB or wheeze. + fatigue, + headache. Has been taking Dayquil/Nyquil, tylenol, cough drops, throat spray. Gets some temporary relief with tylenol. Remote smoking history, no history asthma.   History reviewed. No pertinent past medical history. History reviewed. No pertinent past surgical history. Family History  Problem Relation Age of Onset  . Cirrhosis Father    Social History  Substance Use Topics  . Smoking status: Never Smoker   . Smokeless tobacco: Never Used  . Alcohol Use: No    Review of Systems  Constitutional: Positive for fever, chills and fatigue.  HENT: Positive for postnasal drip and sore throat. Negative for ear pain, rhinorrhea and sinus pressure.   Respiratory: Positive for cough. Negative for shortness of breath and wheezing.   Allergic/Immunologic: Positive for environmental allergies (rare).  Neurological: Positive for headaches.       Objective:   Physical Exam  Constitutional: He is oriented to person, place, and time. He appears well-developed and well-nourished. No distress.  HENT:  Head: Normocephalic and atraumatic.  Right Ear: Tympanic membrane, external ear and ear canal normal.  Left Ear: Tympanic membrane, external ear and ear canal normal.  Nose: Mucosal edema and rhinorrhea present.  Mouth/Throat: Posterior oropharyngeal erythema present. No oropharyngeal exudate or posterior oropharyngeal edema.  Post nasal drainage   Cardiovascular: Normal rate, regular rhythm and normal heart sounds.   Pulmonary/Chest: Effort normal and breath sounds normal.  Musculoskeletal: Normal range of motion.  Neurological: He is alert and oriented to person, place,  and time.  Skin: Skin is warm and dry. He is not diaphoretic.  Psychiatric: He has a normal mood and affect. His behavior is normal. Judgment and thought content normal.  Vitals reviewed.     BP 132/88 mmHg  Pulse 81  Temp(Src) 98.6 F (37 C) (Oral)  Resp 16  Ht 5\' 10"  (1.778 m)  Wt 189 lb (85.73 kg)  BMI 27.12 kg/m2  SpO2 97%     Assessment & Plan:  1. Viral URI - Provided written and verbal information regarding diagnosis and treatment. -For muscle aches, headache, sore throat you can take over the counter acetaminophen or ibuprofen as directed on the package For nasal congestion you can use Afrin nasal spray twice a day for up to 3 days, and /or sudafed, and/or saline nasal spray For cough you can use Delsym cough syrup Please come back to see us or go to the emergency department if you are not better in 5 to 7 days or if you develop fever over 101 for more than 48 hours or if you develop wheezing or shortness of breath.    2. Cough - chlorpheniramine-HYDROcodone (TUSSIONEX PENNKINETIC ER) 10-8 MG/5ML SUER; Take 5 mLs by mouth 2 (two) times daily.  Dispense: 115 mL; Refill: 0   Olean Reeeborah Gessner, FNP-BC  [facility]  06/08/2015 11:24 AM

## 2015-06-08 NOTE — Patient Instructions (Addendum)
For muscle aches, headache, sore throat you can take over the counter acetaminophen or ibuprofen as directed on the package For nasal congestion you can use Afrin nasal spray twice a day for up to 3 days, and /or sudafed, and/or saline nasal spray For cough you can use Delsym cough syrup Please come back to see us or go to the emergency department if you are not better in 5 to 7 days or if you develop fever over 101 for more than 48 hours or if you develop wheezing or shortness of breath.      IF you received an x-ray today, you will receive an invoice from [facility]. Please contact [facility] at [phone] with questions or concerns regarding your invoice.   IF you received labwork today, you will receive an invoice from [facility]. Please contact [facility] at [phone] with questions or concerns regarding your invoice.   Our billing staff will not be able to assist you with questions regarding bills from these companies.  You will be contacted with the lab results as soon as they are available. The fastest way to get your results is to activate your My Chart account. Instructions are located on the last page of this paperwork. If you have not heard from us regarding the results in 2 weeks, please contact this office.

## 2015-10-02 DIAGNOSIS — K080 Exfoliation of teeth due to systemic causes: Secondary | ICD-10-CM | POA: Diagnosis not present

## 2015-11-07 ENCOUNTER — Ambulatory Visit (INDEPENDENT_AMBULATORY_CARE_PROVIDER_SITE_OTHER): Payer: Federal, State, Local not specified - PPO

## 2015-11-07 ENCOUNTER — Ambulatory Visit (INDEPENDENT_AMBULATORY_CARE_PROVIDER_SITE_OTHER): Payer: Federal, State, Local not specified - PPO | Admitting: Family Medicine

## 2015-11-07 VITALS — BP 118/80 | HR 68 | Temp 97.9°F | Ht 70.0 in | Wt 190.0 lb

## 2015-11-07 DIAGNOSIS — M7661 Achilles tendinitis, right leg: Secondary | ICD-10-CM | POA: Diagnosis not present

## 2015-11-07 DIAGNOSIS — M25571 Pain in right ankle and joints of right foot: Secondary | ICD-10-CM | POA: Diagnosis not present

## 2015-11-07 MED ORDER — MELOXICAM 15 MG PO TABS: 15.0000 mg | ORAL_TABLET | Freq: Every day | ORAL | 1 refills | Status: DC

## 2015-11-07 NOTE — Progress Notes (Signed)
   Patient ID: Carlos Bush, male    DOB: 06/21/1975, 40 y.o.   MRN: [id number]  PCP: No PCP Per Patient  Chief Complaint  Patient presents with  . Leg Pain    Right. Since Sunday. NKI    Subjective:   HPI Presents for evaluation of right achilles tendon pain.  Upon awakening he had significant pain in his right heel x 3 days ago. To relieve pain, patient wrapped right heel with mild improvement of symptoms. He had used ice and ibuprofen with mild relief.  He reports swelling of his right ankle, tenderness to touch mid achilles tendon region, more pain with plantar flexion compared to dorsiflexion.  Pain experienced with upward extension of toes. Denies any known injury or maneuver that may have resulted in pain.  . Social History   Social History  . Marital status: Single    Spouse name: N/A  . Number of children: N/A  . Years of education: N/A   Occupational History  . Not on file.   Social History Main Topics  . Smoking status: Never Smoker  . Smokeless tobacco: Never Used  . Alcohol use No  . Drug use: No  . Sexual activity: Yes   Other Topics Concern  . Not on file   Social History Narrative  . No narrative on file    Family History  Problem Relation Age of Onset  . Cirrhosis Father    Review of Systems  Musculoskeletal:       Right Achilles tendon heel pain    Patient Active Problem List   Diagnosis Date Noted  . Myopia-contacts 09/14/2013    Prior to Admission medications   Medication Sig Start Date End Date Taking? Authorizing Provider  Multiple Vitamin (MULTIVITAMIN) capsule Take 1 capsule by mouth daily.   Yes Historical Provider, MD     No Known Allergies     Objective:  Physical Exam  Constitutional: He is oriented to person, place, and time. He appears well-developed and well-nourished.  HENT:  Head: Normocephalic and atraumatic.  Right Ear: External ear normal.  Left Ear: External ear normal.  Nose: Nose normal.  Mouth/Throat:  Oropharynx is clear and moist.  Neck: Normal range of motion.  Cardiovascular: Normal rate.   Pulmonary/Chest: Effort normal and breath sounds normal.  Musculoskeletal: Normal range of motion. He exhibits tenderness.  Neurological: He is alert and oriented to person, place, and time.  Skin: Skin is dry.  .Dg Ankle Complete Right  Result Date: 11/07/2015 CLINICAL DATA:  Right ankle pain.  No known injury. EXAM: RIGHT ANKLE - COMPLETE 3+ VIEW COMPARISON:  No recent prior. FINDINGS: No acute bony or joint abnormality identified. No evidence of fracture or dislocation. IMPRESSION: No acute or focal abnormality . Electronically Signed   By: Maisie Fushomas  Register   On: 11/07/2015 11:48   Vitals:   11/07/15 1059  BP: 118/80  Pulse: 68  Temp: 97.9 F (36.6 C)     Assessment & Plan:  .1. Right Achilles tendinitis - DG Ankle Complete Right- no acute findings  Place ice packs on achilles tendon for 20-30 minute every 3-4 hours up to 3 days or as needed to relieve pain.  Raise your lower right leg on pillow when lying flat.  Take Meloxicam 15 mg once daily with food as need to decrease inflammation and pain.  Follow-up as needed!  Godfrey PickKimberly S. Tiburcio PeaHarris, MSN, FNP-C [facility]

## 2015-11-07 NOTE — Patient Instructions (Addendum)
Place ice packs on achilles tendon for 20-30 minute every 3-4 hours up to 3 days or as needed to relieve pain.  Raise your lower right leg on pillow when lying flat.  Take Meloxicam 15 mg once daily with food as need to decrease inflammation and pain.   IF you received an x-ray today, you will receive an invoice from [facility]. Please contact [facility] at [phone] with questions or concerns regarding your invoice.   IF you received labwork today, you will receive an invoice from [facility]. Please contact [facility] at [phone] with questions or concerns regarding your invoice.   Our billing staff will not be able to assist you with questions regarding bills from these companies.  You will be contacted with the lab results as soon as they are available. The fastest way to get your results is to activate your My Chart account. Instructions are located on the last page of this paperwork. If you have not heard from us regarding the results in 2 weeks, please contact this office.

## 2016-04-22 DIAGNOSIS — K080 Exfoliation of teeth due to systemic causes: Secondary | ICD-10-CM | POA: Diagnosis not present

## 2016-05-17 ENCOUNTER — Ambulatory Visit (INDEPENDENT_AMBULATORY_CARE_PROVIDER_SITE_OTHER): Payer: Federal, State, Local not specified - PPO | Admitting: Urgent Care

## 2016-05-17 ENCOUNTER — Encounter: Payer: Self-pay | Admitting: Urgent Care

## 2016-05-17 ENCOUNTER — Ambulatory Visit (INDEPENDENT_AMBULATORY_CARE_PROVIDER_SITE_OTHER): Payer: Self-pay

## 2016-05-17 VITALS — BP 126/90 | HR 73 | Temp 98.0°F | Ht 70.0 in | Wt 185.0 lb

## 2016-05-17 DIAGNOSIS — R0981 Nasal congestion: Secondary | ICD-10-CM

## 2016-05-17 DIAGNOSIS — R0982 Postnasal drip: Secondary | ICD-10-CM | POA: Diagnosis not present

## 2016-05-17 DIAGNOSIS — R05 Cough: Secondary | ICD-10-CM

## 2016-05-17 DIAGNOSIS — R059 Cough, unspecified: Secondary | ICD-10-CM

## 2016-05-17 DIAGNOSIS — J181 Lobar pneumonia, unspecified organism: Secondary | ICD-10-CM

## 2016-05-17 DIAGNOSIS — R0602 Shortness of breath: Secondary | ICD-10-CM

## 2016-05-17 DIAGNOSIS — J189 Pneumonia, unspecified organism: Secondary | ICD-10-CM

## 2016-05-17 DIAGNOSIS — R5383 Other fatigue: Secondary | ICD-10-CM

## 2016-05-17 MED ORDER — HYDROCOD POLST-CPM POLST ER 10-8 MG/5ML PO SUER: 5.0000 mL | Freq: Every evening | ORAL | 0 refills | Status: DC | PRN

## 2016-05-17 MED ORDER — CETIRIZINE HCL 10 MG PO TABS: 10.0000 mg | ORAL_TABLET | Freq: Every day | ORAL | 11 refills | Status: DC

## 2016-05-17 MED ORDER — AZITHROMYCIN 250 MG PO TABS: ORAL_TABLET | ORAL | 0 refills | Status: DC

## 2016-05-17 MED ORDER — PSEUDOEPHEDRINE HCL ER 120 MG PO TB12: 120.0000 mg | ORAL_TABLET | Freq: Two times a day (BID) | ORAL | 3 refills | Status: DC

## 2016-05-17 MED ORDER — BENZONATATE 100 MG PO CAPS: 100.0000 mg | ORAL_CAPSULE | Freq: Three times a day (TID) | ORAL | 0 refills | Status: DC | PRN

## 2016-05-17 NOTE — Progress Notes (Signed)
    MRN: [id number] DOB: 01/04/1976  Subjective:   Carlos Bush is a 41 y.o. male presenting for chief complaint of Cough (X 1 week); Sinus Problem (X 3 week with yellow/green mucus); and Fatigue ( with SOB)  Reports 3 week history of worsening drainage of his nose, sinus congestion, watery eyes. Has started having a productive cough, elicits shob, fatigue, post-tussive nausea and emesis. Has tried Mucinex, Delsym, Sudafed with some relief. Denies fever, chest pain, n/v, abdominal pain, ear pain, sore throat. Denies history of asthma or allergies. Denies smoking cigarettes. Did not take flu shot this season.   Gerri SporeWesley has a current medication list which includes the following prescription(s): multivitamin. Also has No Known Allergies. Gerri SporeWesley denies past medical and surgical history.   Objective:   Vitals: BP 126/90 (BP Location: Right Arm, Patient Position: Sitting, Cuff Size: Large)   Pulse 73   Temp 98 F (36.7 C) (Oral)   Ht 5\' 10"  (1.778 m)   Wt 185 lb (83.9 kg)   SpO2 100%   BMI 26.54 kg/m   Physical Exam  Constitutional: He is oriented to person, place, and time. He appears well-developed and well-nourished.  HENT:  TM's intact bilaterally, no effusions or erythema. Nasal turbinates erythematous and dry, nasal passages patent. No sinus tenderness. Oropharynx with moderate thick streaks of post-nasal drainage, mucous membranes moist, dentition in good repair.  Eyes: Right eye exhibits no discharge. Left eye exhibits no discharge.  Neck: Normal range of motion. Neck supple.  Cardiovascular: Normal rate, regular rhythm and intact distal pulses.  Exam reveals no gallop and no friction rub.   No murmur heard. Pulmonary/Chest: No respiratory distress. He has no wheezes. He has no rales.  Lymphadenopathy:    He has no cervical adenopathy.  Neurological: He is alert and oriented to person, place, and time.  Skin: Skin is warm and dry.   Dg Chest 2 View  Result Date:  05/17/2016 CLINICAL DATA:  Cough. EXAM: CHEST  2 VIEW COMPARISON:  02/21/2010 . FINDINGS: Mediastinum hilar structures normal. Lungs are clear. Patch that mild left base subsegmental atelectasis and infiltrate. No pleural effusion or pneumothorax. Heart size normal. No acute bony abnormality . IMPRESSION: Mild left base subsegmental atelectasis and infiltrate. Electronically Signed   By: Maisie Fushomas  Register   On: 05/17/2016 15:59   Assessment and Plan :   1. Community acquired pneumonia of left lower lobe of lung (HCC) 2. Cough 3. Shortness of breath 4. Fatigue, unspecified type 5. Sinus congestion 6. Post-nasal drainage - Start azithromycin, offered cough suppression. RTC in 1 week if no improvement.  Wallis BambergMario Augustino Savastano, PA-C Primary Care at [facility] [phone] 05/17/2016  3:31 PM

## 2016-05-17 NOTE — Patient Instructions (Addendum)
Community-Acquired Pneumonia, Adult Pneumonia is an infection of the lungs. There are different types of pneumonia. One type can develop while a person is in a hospital. A different type, called community-acquired pneumonia, develops in people who are not, or have not recently been, in the hospital or other health care facility. What are the causes? Pneumonia may be caused by bacteria, viruses, or funguses. Community-acquired pneumonia is often caused by Streptococcus pneumonia bacteria. These bacteria are often passed from one person to another by breathing in droplets from the cough or sneeze of an infected person. What increases the risk? The condition is more likely to develop in:  People who havechronic diseases, such as chronic obstructive pulmonary disease (COPD), asthma, congestive heart failure, cystic fibrosis, diabetes, or kidney disease.  People who haveearly-stage or late-stage HIV.  People who havesickle cell disease.  People who havehad their spleen removed (splenectomy).  People who havepoor Human resources officer.  People who havemedical conditions that increase the risk of breathing in (aspirating) secretions their own mouth and nose.  People who havea weakened immune system (immunocompromised).  People who smoke.  People whotravel to areas where pneumonia-causing germs commonly exist.  People whoare around animal habitats or animals that have pneumonia-causing germs, including birds, bats, rabbits, cats, and farm animals. What are the signs or symptoms? Symptoms of this condition include:  Adry cough.  A wet (productive) cough.  Fever.  Sweating.  Chest pain, especially when breathing deeply or coughing.  Rapid breathing or difficulty breathing.  Shortness of breath.  Shaking chills.  Fatigue.  Muscle aches. How is this diagnosed? Your health care provider will take a medical history and perform a physical exam. You may also have other tests,  including:  Imaging studies of your chest, including X-rays.  Tests to check your blood oxygen level and other blood gases.  Other tests on blood, mucus (sputum), fluid around your lungs (pleural fluid), and urine. If your pneumonia is severe, other tests may be done to identify the specific cause of your illness. How is this treated? The type of treatment that you receive depends on many factors, such as the cause of your pneumonia, the medicines you take, and other medical conditions that you have. For most adults, treatment and recovery from pneumonia may occur at home. In some cases, treatment must happen in a hospital. Treatment may include:  Antibiotic medicines, if the pneumonia was caused by bacteria.  Antiviral medicines, if the pneumonia was caused by a virus.  Medicines that are given by mouth or through an IV tube.  Oxygen.  Respiratory therapy. Although rare, treating severe pneumonia may include:  Mechanical ventilation. This is done if you are not breathing well on your own and you cannot maintain a safe blood oxygen level.  Thoracentesis. This procedureremoves fluid around one lung or both lungs to help you breathe better. Follow these instructions at home:  Take over-the-counter and prescription medicines only as told by your health care provider.  Only takecough medicine if you are losing sleep. Understand that cough medicine can prevent your body's natural ability to remove mucus from your lungs.  If you were prescribed an antibiotic medicine, take it as told by your health care provider. Do not stop taking the antibiotic even if you start to feel better.  Sleep in a semi-upright position at night. Try sleeping in a reclining chair, or place a few pillows under your head.  Do not use tobacco products, including cigarettes, chewing tobacco, and e-cigarettes. If you  tobacco, and e-cigarettes. If you need help quitting, ask your health care provider.  Drink enough water to keep your urine  clear or pale yellow. This will help to thin out mucus secretions in your lungs. How is this prevented? There are ways that you can decrease your risk of developing community-acquired pneumonia. Consider getting a pneumococcal vaccine if:  You are older than 40 years of age.  You are older than 40 years of age and are undergoing cancer treatment, have chronic lung disease, or have other medical conditions that affect your immune system. Ask your health care provider if this applies to you.  There are different types and schedules of pneumococcal vaccines. Ask your health care provider which vaccination option is best for you. You may also prevent community-acquired pneumonia if you take these actions:  Get an influenza vaccine every year. Ask your health care provider which type of influenza vaccine is best for you.  Go to the dentist on a regular basis.  Wash your hands often. Use hand sanitizer if soap and water are not available.  Contact a health care provider if:  You have a fever.  You are losing sleep because you cannot control your cough with cough medicine. Get help right away if:  You have worsening shortness of breath.  You have increased chest pain.  Your sickness becomes worse, especially if you are an older adult or have a weakened immune system.  You cough up blood. This information is not intended to replace advice given to you by your health care provider. Make sure you discuss any questions you have with your health care provider. Document Released: 03/04/2005 Document Revised: 07/13/2015 Document Reviewed: 06/29/2014 Elsevier Interactive Patient Education  2017 Elsevier Inc.    IF you received an x-ray today, you will receive an invoice from [facility]. Please contact [facility] at [phone] with questions or concerns regarding your invoice.   IF you received labwork today, you will receive an invoice from [facility]. Please contact  [facility] at [phone] with questions or concerns regarding your invoice.   Our billing staff will not be able to assist you with questions regarding bills from these companies.  You will be contacted with the lab results as soon as they are available. The fastest way to get your results is to activate your My Chart account. Instructions are located on the last page of this paperwork. If you have not heard from us regarding the results in 2 weeks, please contact this office.      

## 2016-07-18 ENCOUNTER — Ambulatory Visit (INDEPENDENT_AMBULATORY_CARE_PROVIDER_SITE_OTHER): Payer: Federal, State, Local not specified - PPO | Admitting: Urgent Care

## 2016-07-18 ENCOUNTER — Encounter: Payer: Self-pay | Admitting: Urgent Care

## 2016-07-18 VITALS — BP 127/84 | HR 77 | Resp 18 | Ht 70.04 in | Wt 187.6 lb

## 2016-07-18 DIAGNOSIS — H938X3 Other specified disorders of ear, bilateral: Secondary | ICD-10-CM | POA: Diagnosis not present

## 2016-07-18 DIAGNOSIS — J029 Acute pharyngitis, unspecified: Secondary | ICD-10-CM

## 2016-07-18 DIAGNOSIS — J020 Streptococcal pharyngitis: Secondary | ICD-10-CM | POA: Diagnosis not present

## 2016-07-18 LAB — POCT RAPID STREP A (OFFICE): RAPID STREP A SCREEN: POSITIVE — AB

## 2016-07-18 MED ORDER — AMOXICILLIN 875 MG PO TABS: 875.0000 mg | ORAL_TABLET | Freq: Two times a day (BID) | ORAL | 0 refills | Status: DC

## 2016-07-18 NOTE — Patient Instructions (Signed)

## 2016-07-18 NOTE — Progress Notes (Signed)
  MRN: [id number] DOB: 01/28/1976  Subjective:   Carlos Bush is a 41 y.o. male presenting for chief complaint of Sore Throat (couple days)  Reports 2 day history of sore throat without cough. Has also had bilateral ear stuffiness, cervical lymph node pain. Has tried Sudafed with some relief. Denies fever, sinus pain, sinus congestion, ear pain, ear drainage, cough. Denies smoking cigarettes.  Carlos Bush has a current medication list which includes the following prescription(s): multivitamin and pseudoephedrine. Also has No Known Allergies. Carlos Bush denies past medical and surgical history.  Objective:   Vitals: BP 127/84   Pulse 77   Resp 18   Ht 5' 10.04" (1.779 m)   Wt 187 lb 9.3 oz (85.1 kg)   SpO2 98%   BMI 26.88 kg/m   Physical Exam  Constitutional: He is oriented to person, place, and time. He appears well-developed and well-nourished.  HENT:  TM's intact bilaterally, no effusions or erythema. Nasal turbinates pink and moist, nasal passages patent. No sinus tenderness. Throat has significant erythema without exudates, mucous membranes moist.  Eyes: Right eye exhibits no discharge. Left eye exhibits no discharge.  Neck: Normal range of motion. Neck supple.  Cardiovascular: Normal rate.   Pulmonary/Chest: Effort normal.  Lymphadenopathy:    He has no cervical adenopathy.  Neurological: He is alert and oriented to person, place, and time.  Skin: Skin is warm and dry.  Psychiatric: He has a normal mood and affect.   Results for orders placed or performed in visit on 07/18/16 (from the past 24 hour(s))  POCT rapid strep A     Status: Abnormal   Collection Time: 07/18/16 10:40 AM  Result Value Ref Range   Rapid Strep A Screen Positive (A) Negative   Assessment and Plan :   1. Strep pharyngitis 2. Sore throat - Start amoxicillin, recheck as needed.  3. Ear fullness, bilateral - Continue Sudafed, use antihistamine as needed.  Wallis BambergMario Adaysha Dubinsky, PA-C Primary Care at  [facility] [phone] 07/18/2016  10:42 AM

## 2016-11-05 DIAGNOSIS — K080 Exfoliation of teeth due to systemic causes: Secondary | ICD-10-CM | POA: Diagnosis not present

## 2017-06-11 DIAGNOSIS — K080 Exfoliation of teeth due to systemic causes: Secondary | ICD-10-CM | POA: Diagnosis not present

## 2017-06-27 ENCOUNTER — Ambulatory Visit: Payer: Federal, State, Local not specified - PPO | Admitting: Physician Assistant

## 2017-06-27 ENCOUNTER — Encounter: Payer: Self-pay | Admitting: Physician Assistant

## 2017-06-27 VITALS — BP 134/84 | HR 69 | Temp 98.2°F | Resp 16 | Ht 69.5 in | Wt 185.0 lb

## 2017-06-27 DIAGNOSIS — B029 Zoster without complications: Secondary | ICD-10-CM

## 2017-06-27 DIAGNOSIS — R21 Rash and other nonspecific skin eruption: Secondary | ICD-10-CM | POA: Diagnosis not present

## 2017-06-27 MED ORDER — VALACYCLOVIR HCL 1 G PO TABS: 1000.0000 mg | ORAL_TABLET | Freq: Three times a day (TID) | ORAL | 0 refills | Status: AC

## 2017-06-27 NOTE — Patient Instructions (Addendum)
Start taking Valacyclovir 1,000 mg three times daily for seven days. Please let me know if you develop pain with this rash.   Patients with herpes zoster can transmit varicella zoster virus (VZV) to individuals who have not had varicella and have not received the varicella vaccine. VZV is transmitted from person to person by direct contact or by aerosolization of virus from skin lesions.  Patients with localized zoster are not infectious before vesicles appear and are no longer infectious when the lesions have re-epithelized. Until the rash has crusted, patients should be advised to:  ?Keep the rash covered, if feasible, and to wash their hands often to prevent the spread of virus to others.  ?Avoid contact with pregnant women who have never had chickenpox or the varicella vaccine, premature or low birth weight infants, and immunocompromised individuals.   Thank you for allowing me to take part in caring for your health.  Feel free to call PCP if you have any questions or further requests.  Please consider signing up for MyChart if you do not already have it, as this is a great way to communicate with me.  Best,  Whitney McVey, PA-C  Shingles Shingles is an infection that causes a painful skin rash and fluid-filled blisters. Shingles is caused by the same virus that causes chickenpox. Shingles only develops in people who:  Have had chickenpox.  Have gotten the chickenpox vaccine. (This is rare.)  The first symptoms of shingles may be itching, tingling, or pain in an area on your skin. A rash will follow in a few days or weeks. The rash is usually on one side of the body in a bandlike or beltlike pattern. Over time, the rash turns into fluid-filled blisters that break open, scab over, and dry up. Medicines may:  Help you manage pain.  Help you recover more quickly.  Help to prevent long-term problems.  Follow these instructions at home: Medicines  Take medicines only as told by your  doctor.  Apply an anti-itch or numbing cream to the affected area as told by your doctor. Blister and Rash Care  Take a cool bath or put cool compresses on the area of the rash or blisters as told by your doctor. This may help with pain and itching.  Keep your rash covered with a loose bandage (dressing). Wear loose-fitting clothing.  Keep your rash and blisters clean with mild soap and cool water or as told by your doctor.  Check your rash every day for signs of infection. These include redness, swelling, and pain that lasts or gets worse.  Do not pick your blisters.  Do not scratch your rash. General instructions  Rest as told by your doctor.  Keep all follow-up visits as told by your doctor. This is important.  Until your blisters scab over, your infection can cause chickenpox in people who have never had it or been vaccinated against it. To prevent this from happening, avoid touching other people or being around other people, especially: ? Babies. ? Pregnant women. ? Children who have eczema. ? Elderly people who have transplants. ? People who have chronic illnesses, such as leukemia or AIDS. Contact a doctor if:  Your pain does not get better with medicine.  Your pain does not get better after the rash heals.  Your rash looks infected. Signs of infection include: ? Redness. ? Swelling. ? Pain that lasts or gets worse. Get help right away if:  The rash is on your face or nose.  You have pain in your face, pain around your eye area, or loss of feeling on one side of your face.  You have ear pain or you have ringing in your ear.  You have loss of taste.  Your condition gets worse. This information is not intended to replace advice given to you by your health care provider. Make sure you discuss any questions you have with your health care provider. Document Released: 08/21/2007 Document Revised: 10/29/2015 Document Reviewed: 12/14/2013 Elsevier Interactive Patient  Education  2018 [employer]  IF you received an x-ray today, you will receive an invoice from [facility]. Please contact [facility] at [phone] with questions or concerns regarding your invoice.   IF you received labwork today, you will receive an invoice from [facility]. Please contact [facility] at [phone] with questions or concerns regarding your invoice.   Our billing staff will not be able to assist you with questions regarding bills from these companies.  You will be contacted with the lab results as soon as they are available. The fastest way to get your results is to activate your My Chart account. Instructions are located on the last page of this paperwork. If you have not heard from us regarding the results in 2 weeks, please contact this office.

## 2017-06-27 NOTE — Progress Notes (Signed)
   Daphine DeutscherWesley L Rozier  MRN: [id number] DOB: 08/10/1975  PCP: Patient, No Pcp Per  Subjective:  Pt is a 42 year old male who presents to clinic for rash x 1.5 weeks. Started as an itchy spot with no rash. Rash developed about one week ago. He has been putting alcohol on it.  Denies pain, drainage, n/v, abdominal pain, sore throat, urinary symptoms.  He had chickenpox as a child.   Review of Systems  Constitutional: Negative for chills, diaphoresis, fatigue and fever.  Gastrointestinal: Negative for abdominal pain, nausea and vomiting.  Skin: Positive for rash.  Neurological: Negative for dizziness, numbness and headaches.    Patient Active Problem List   Diagnosis Date Noted  . Myopia-contacts 09/14/2013    Current Outpatient Medications on File Prior to Visit  Medication Sig Dispense Refill  . Multiple Vitamin (MULTIVITAMIN) capsule Take 1 capsule by mouth daily.     No current facility-administered medications on file prior to visit.     No Known Allergies   Objective:  BP 134/84   Pulse 69   Temp 98.2 F (36.8 C) (Oral)   Resp 16   Ht 5' 9.5" (1.765 m)   Wt 185 lb (83.9 kg)   SpO2 97%   BMI 26.93 kg/m   Physical Exam  Constitutional: He is oriented to person, place, and time. He appears well-developed and well-nourished.  Pulmonary/Chest: Effort normal. No respiratory distress.  Neurological: He is alert and oriented to person, place, and time.  Skin: Skin is warm and dry. Rash noted.     Psychiatric: He has a normal mood and affect. His behavior is normal. Judgment and thought content normal.  Vitals reviewed.   Assessment and Plan :  1. Herpes zoster without complication 2. Rash and nonspecific skin eruption - valACYclovir (VALTREX) 1000 MG tablet; Take 1 tablet (1,000 mg total) by mouth 3 (three) times daily for 7 days.  Dispense: 20 tablet; Refill: 0 - Pt c/o rash x 1 week. New lesion appearing in new location along same dermatome. Plan to treat for active  herpes zoster. He is not in pain, so no need for pain management. He understands and agrees with plan.   Marco CollieWhitney Sherwin Hollingshed, PA-C  Primary Care at [facility] 06/27/2017 2:23 PM

## 2017-07-26 ENCOUNTER — Other Ambulatory Visit: Payer: Self-pay

## 2017-07-26 ENCOUNTER — Encounter: Payer: Self-pay | Admitting: Family Medicine

## 2017-07-26 ENCOUNTER — Ambulatory Visit: Payer: Federal, State, Local not specified - PPO | Admitting: Family Medicine

## 2017-07-26 VITALS — BP 118/86 | HR 72 | Temp 98.1°F | Resp 12 | Ht 69.5 in | Wt 186.8 lb

## 2017-07-26 DIAGNOSIS — H1031 Unspecified acute conjunctivitis, right eye: Secondary | ICD-10-CM | POA: Diagnosis not present

## 2017-07-26 DIAGNOSIS — J301 Allergic rhinitis due to pollen: Secondary | ICD-10-CM | POA: Diagnosis not present

## 2017-07-26 MED ORDER — AMOXICILLIN 500 MG PO TABS
1000.0000 mg | ORAL_TABLET | Freq: Two times a day (BID) | ORAL | 0 refills | Status: DC
Start: 2017-07-26 — End: 2019-01-13

## 2017-07-26 MED ORDER — OFLOXACIN 0.3 % OP SOLN: 2.0000 [drp] | Freq: Four times a day (QID) | OPHTHALMIC | 0 refills | Status: DC

## 2017-07-26 NOTE — Progress Notes (Signed)
Subjective:    Patient ID: Carlos Bush, male    DOB: 02/20/1976, 42 y.o.   MRN: [id number]  07/26/2017  Eye redness (Right eye redness onset 2 am. Daughter had pink eye, tried using her antibiotic drops. No pain, reports swelling. Hx of nasal drainage, stuffy nose x1 week)    HPI This 42 y.o. male presents for evaluation of RIGHT pink eye and nasal congestion.   RIGHT eye swollen and drainage; crusting yellow.  Draining during the day today.  Daughter with pink eye. Now LEFT eye is getting crusty now as well. No fever/chills/sweats. No headache.  No sore throat or ear pain; pressure in ears B. Pressure behind eyes. Sinus pressure for one week. Mild rhinorrhea; +nasal congestion. Taking Mucinex and Sudafed and Zyrtec. No cough.  No SOB.   No n/v/d. No rash. 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    Visual Acuity Screening   Right eye Left eye Both eyes  Without correction:     With correction: 20/30 20/25 2020    BP Readings from Last 3 Encounters:  07/26/17 118/86  06/27/17 134/84  07/18/16 127/84   Wt Readings from Last 3 Encounters:  07/26/17 186 lb 12.8 oz (84.7 kg)  06/27/17 185 lb (83.9 kg)  07/18/16 187 lb 9.3 oz (85.1 kg)    There is no immunization history on file for this patient.  Review of Systems  Constitutional: Negative for activity change, appetite change, chills, diaphoresis, fatigue and fever.  HENT: Positive for congestion, postnasal drip, rhinorrhea and sinus pressure. Negative for ear pain, sinus pain, sore throat, trouble swallowing and voice change.   Eyes: Positive for discharge, redness and itching. Negative for photophobia, pain and visual disturbance.  Respiratory: Negative for cough and shortness of breath.   Cardiovascular: Negative for chest pain, palpitations and leg swelling.  Gastrointestinal: Negative for abdominal pain, diarrhea, nausea and vomiting.  Endocrine: Negative for cold intolerance, heat intolerance, polydipsia,  polyphagia and polyuria.  Skin: Negative for color change, rash and wound.  Neurological: Negative for dizziness, tremors, seizures, syncope, facial asymmetry, speech difficulty, weakness, light-headedness, numbness and headaches.  Psychiatric/Behavioral: Negative for dysphoric mood and sleep disturbance. The patient is not nervous/anxious.     History reviewed. No pertinent past medical history. History reviewed. No pertinent surgical history. No Known Allergies Current Outpatient Medications on File Prior to Visit  Medication Sig Dispense Refill  . cetirizine (ZYRTEC ALLERGY) 10 MG tablet Take 10 mg by mouth daily.    . Guaifenesin (MUCINEX MAXIMUM STRENGTH) 1200 MG TB12 Take by mouth.    . Ibuprofen (ADVIL PO) Take by mouth. Taking 2-3 daily.    . Multiple Vitamin (MULTIVITAMIN) capsule Take 1 capsule by mouth daily.    . Pseudoephedrine HCl (SUDAFED 24 HOUR PO) Take by mouth.     No current facility-administered medications on file prior to visit.    Social History   Socioeconomic History  . Marital status: Single    Spouse name: Not on file  . Number of children: Not on file  . Years of education: Not on file  . Highest education level: Not on file  Occupational History  . Not on file  Social Needs  . Financial resource strain: Not on file  . Food insecurity:    Worry: Not on file    Inability: Not on file  . Transportation needs:    Medical: Not on file    Non-medical: Not on file  Tobacco Use  . Smoking status: Never  Smoker  . Smokeless tobacco: Never Used  Substance and Sexual Activity  . Alcohol use: No    Alcohol/week: 0.0 oz  . Drug use: No  . Sexual activity: Yes  Lifestyle  . Physical activity:    Days per week: Not on file    Minutes per session: Not on file  . Stress: Not on file  Relationships  . Social connections:    Talks on phone: Not on file    Gets together: Not on file    Attends religious service: Not on file    Active member of club or  organization: Not on file    Attends meetings of clubs or organizations: Not on file    Relationship status: Not on file  . Intimate partner violence:    Fear of current or ex partner: Not on file    Emotionally abused: Not on file    Physically abused: Not on file    Forced sexual activity: Not on file  Other Topics Concern  . Not on file  Social History Narrative  . Not on file   Family History  Problem Relation Age of Onset  . Cirrhosis Father        Objective:    BP 118/86   Pulse 72   Temp 98.1 F (36.7 C)   Resp 12   Ht 5' 9.5" (1.765 m)   Wt 186 lb 12.8 oz (84.7 kg)   BMI 27.19 kg/m  Physical Exam  Constitutional: He is oriented to person, place, and time. He appears well-developed and well-nourished. No distress.  HENT:  Head: Normocephalic and atraumatic.  Right Ear: External ear and ear canal normal.  Left Ear: External ear and ear canal normal.  Nose: Mucosal edema and rhinorrhea present. Right sinus exhibits no maxillary sinus tenderness and no frontal sinus tenderness. Left sinus exhibits no maxillary sinus tenderness.  Mouth/Throat: Oropharynx is clear and moist. No oropharyngeal exudate.  Eyes: Pupils are equal, round, and reactive to light. EOM and lids are normal. Lids are everted and swept, no foreign bodies found. Right eye exhibits discharge. Right eye exhibits no hordeolum. No foreign body present in the right eye. Left eye exhibits no chemosis, no discharge, no exudate and no hordeolum. No foreign body present in the left eye. Right conjunctiva is injected. Right conjunctiva has no hemorrhage. Left conjunctiva is not injected. Left conjunctiva has no hemorrhage. Right eye exhibits normal extraocular motion. Left eye exhibits normal extraocular motion.  Neck: Normal range of motion. Neck supple. Carotid bruit is not present. No thyromegaly present.  Cardiovascular: Normal rate, regular rhythm, normal heart sounds and intact distal pulses. Exam reveals no  gallop and no friction rub.  No murmur heard. Pulmonary/Chest: Effort normal and breath sounds normal. He has no wheezes. He has no rales.  Lymphadenopathy:    He has no cervical adenopathy.  Neurological: He is alert and oriented to person, place, and time. No cranial nerve deficit.  Skin: Skin is warm and dry. No rash noted. He is not diaphoretic.  Psychiatric: He has a normal mood and affect. His behavior is normal.  Nursing note and vitals reviewed.  No results found. Depression screen [facility] 2/9 07/26/2017 07/18/2016 05/17/2016 11/07/2015 06/08/2015  Decreased Interest 0 0 0 0 0  Down, Depressed, Hopeless 0 0 0 0 0  PHQ - 2 Score 0 0 0 0 0   Fall Risk  07/26/2017 07/18/2016 05/17/2016 11/07/2015  Falls in the past year? No No No No  Assessment & Plan:   1. Acute bacterial conjunctivitis of right eye   2. Seasonal allergic rhinitis due to pollen     Acute bacterial conjunctivitis of right eye: New.  Prescription for Ocuflox  Provided.  Allergic rhinitis: Uncontrolled at this time.  Continue with current regimen.  If no improvement in 1 week, fill amoxicillin therapy for acute sinusitis.  No orders of the defined types were placed in this encounter.  Meds ordered this encounter  Medications  . ofloxacin (OCUFLOX) 0.3 % ophthalmic solution    Sig: Place 2 drops into both eyes 4 (four) times daily.    Dispense:  10 mL    Refill:  0  . amoxicillin (AMOXIL) 500 MG tablet    Sig: Take 2 tablets (1,000 mg total) by mouth 2 (two) times daily.    Dispense:  40 tablet    Refill:  0    PLACE ON HOLD FOR PATIENT.    No follow-ups on file.   Mariame Rybolt Paulita Fujita, M.D. Primary Care at [facility] previously [facility] [address] [place], [place]  [address] [phone] phone [phone] fax

## 2017-07-26 NOTE — Patient Instructions (Addendum)
   IF you received an x-ray today, you will receive an invoice from [facility]. Please contact [facility] at [phone] with questions or concerns regarding your invoice.   IF you received labwork today, you will receive an invoice from [facility]. Please contact [facility] at [phone] with questions or concerns regarding your invoice.   Our billing staff will not be able to assist you with questions regarding bills from these companies.  You will be contacted with the lab results as soon as they are available. The fastest way to get your results is to activate your My Chart account. Instructions are located on the last page of this paperwork. If you have not heard from us regarding the results in 2 weeks, please contact this office.      Bacterial Conjunctivitis Bacterial conjunctivitis is an infection of the clear membrane that covers the white part of your eye and the inner surface of your eyelid (conjunctiva). When the blood vessels in your conjunctiva become inflamed, your eye becomes red or pink, and it will probably feel itchy. Bacterial conjunctivitis spreads very easily from person to person (is contagious). It also spreads easily from one eye to the other eye. What are the causes? This condition is caused by several common bacteria. You may get the infection if you come into close contact with another person who is infected. You may also come into contact with items that are contaminated with the bacteria, such as a face towel, contact lens solution, or eye makeup. What increases the risk? This condition is more likely to develop in people who:  Are exposed to other people who have the infection.  Wear contact lenses.  Have a sinus infection.  Have had a recent eye injury or surgery.  Have a weak body defense system (immune system).  Have a medical condition that causes dry eyes. What are the signs or symptoms? Symptoms of this condition  include:  Eye redness.  Tearing or watery eyes.  Itchy eyes.  Burning feeling in your eyes.  Thick, yellowish discharge from an eye. This may turn into a crust on the eyelid overnight and cause your eyelids to stick together.  Swollen eyelids.  Blurred vision. How is this diagnosed? Your health care provider can diagnose this condition based on your symptoms and medical history. Your health care provider may also take a sample of discharge from your eye to find the cause of your infection. This is rarely done. How is this treated? Treatment for this condition includes:  Antibiotic eye drops or ointment to clear the infection more quickly and prevent the spread of infection to others.  Oral antibiotic medicines to treat infections that do not respond to drops or ointments, or last longer than 10 days.  Cool, wet cloths (cool compresses) placed on the eyes.  Artificial tears applied 2-6 times a day. Follow these instructions at home: Medicines  Take or apply your antibiotic medicine as told by your health care provider. Do not stop taking or applying the antibiotic even if you start to feel better.  Take or apply over-the-counter and prescription medicines only as told by your health care provider.  Be very careful to avoid touching the edge of your eyelid with the eye drop bottle or the ointment tube when you apply medicines to the affected eye. This will keep you from spreading the infection to your other eye or to other people. Managing discomfort  Gently wipe away any drainage from your eye with a   warm, wet washcloth or a cotton ball.  Apply a cool, clean washcloth to your eye for 10-20 minutes, 3-4 times a day. General instructions  Do not wear contact lenses until the inflammation is gone and your health care provider says it is safe to wear them again. Ask your health care provider how to sterilize or replace your contact lenses before you use them again. Wear glasses  until you can resume wearing contacts.  Avoid wearing eye makeup until the inflammation is gone. Throw away any old eye cosmetics that may be contaminated.  Change or wash your pillowcase every day.  Do not share towels or washcloths. This may spread the infection.  Wash your hands often with soap and water. Use paper towels to dry your hands.  Avoid touching or rubbing your eyes.  Do not drive or use heavy machinery if your vision is blurred. Contact a health care provider if:  You have a fever.  Your symptoms do not get better after 10 days. Get help right away if:  You have a fever and your symptoms suddenly get worse.  You have severe pain when you move your eye.  You have facial pain, redness, or swelling.  You have sudden loss of vision. This information is not intended to replace advice given to you by your health care provider. Make sure you discuss any questions you have with your health care provider. Document Released: 03/04/2005 Document Revised: 07/13/2015 Document Reviewed: 12/15/2014 Elsevier Interactive Patient Education  2017 [employer]  

## 2017-08-07 ENCOUNTER — Encounter: Payer: Self-pay | Admitting: Family Medicine

## 2017-08-24 IMAGING — DX DG ANKLE COMPLETE 3+V*R*
3 series · 3 of 3 positions shown · non-contrast
Comparison: No recent prior.

CLINICAL DATA: Right ankle pain.  No known injury.

EXAM:
RIGHT ANKLE - COMPLETE 3+ VIEW

[ankle ap]
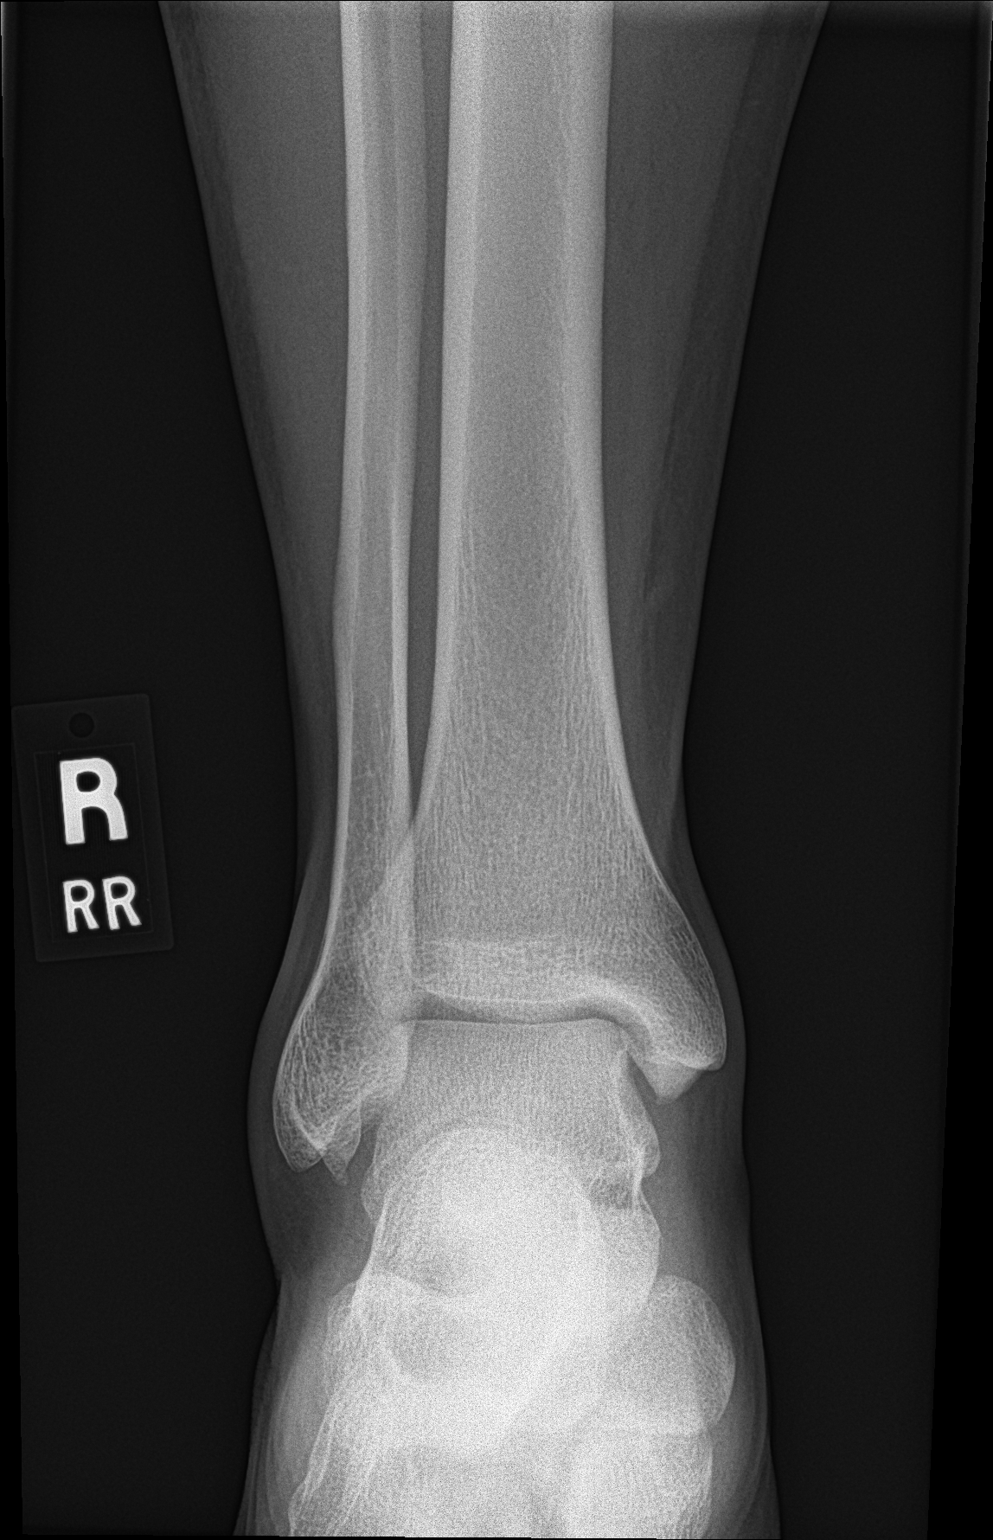

[ankle obl]
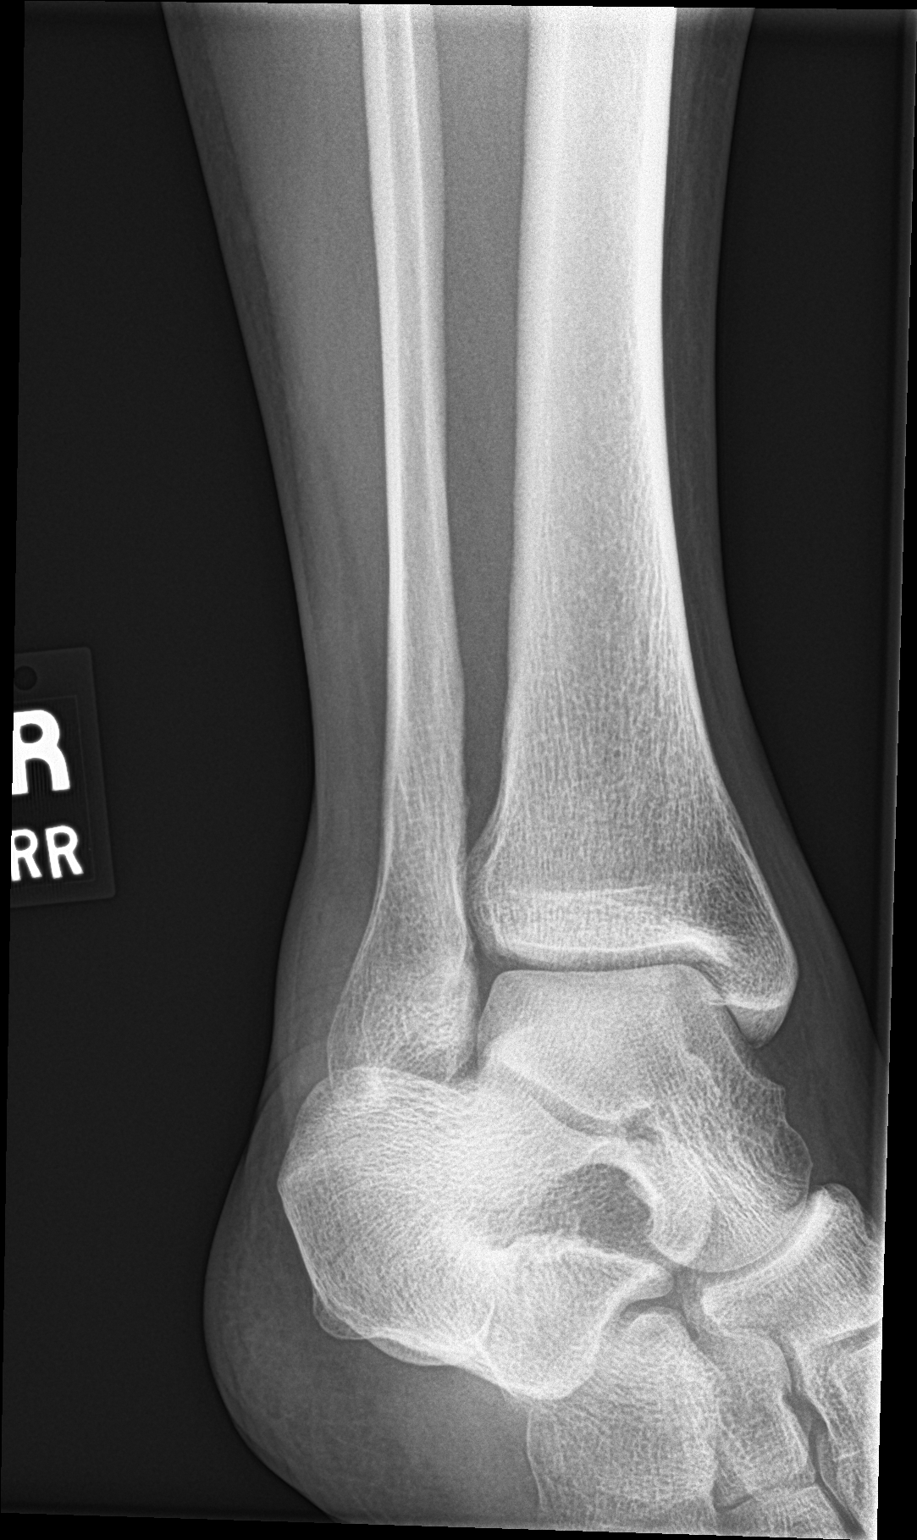

[ankle lat]
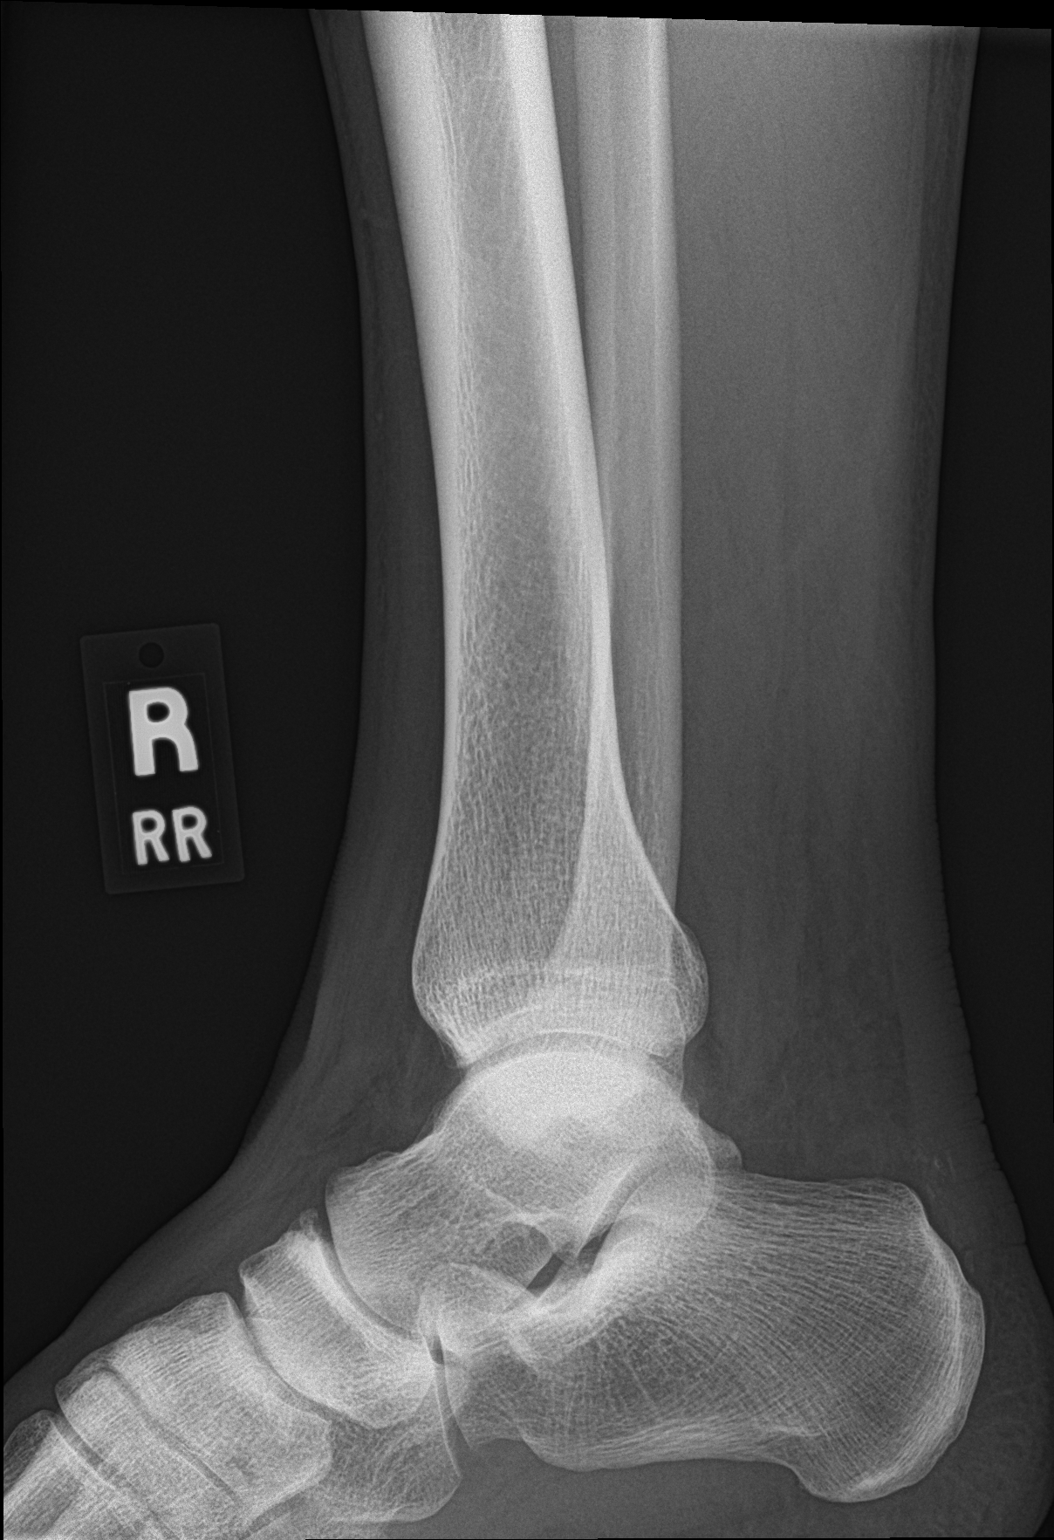

[3 of 3 positions shown; findings below may reference images not displayed]

FINDINGS: No acute bony or joint abnormality identified. No evidence of
fracture or dislocation.
IMPRESSION: No acute or focal abnormality .

## 2017-12-30 DIAGNOSIS — K080 Exfoliation of teeth due to systemic causes: Secondary | ICD-10-CM | POA: Diagnosis not present

## 2018-03-04 IMAGING — DX DG CHEST 2V
2 series · 2 of 2 positions shown · non-contrast
Comparison: 02/21/2010 .

CLINICAL DATA: Cough.

EXAM:
CHEST  2 VIEW

[chest pa]
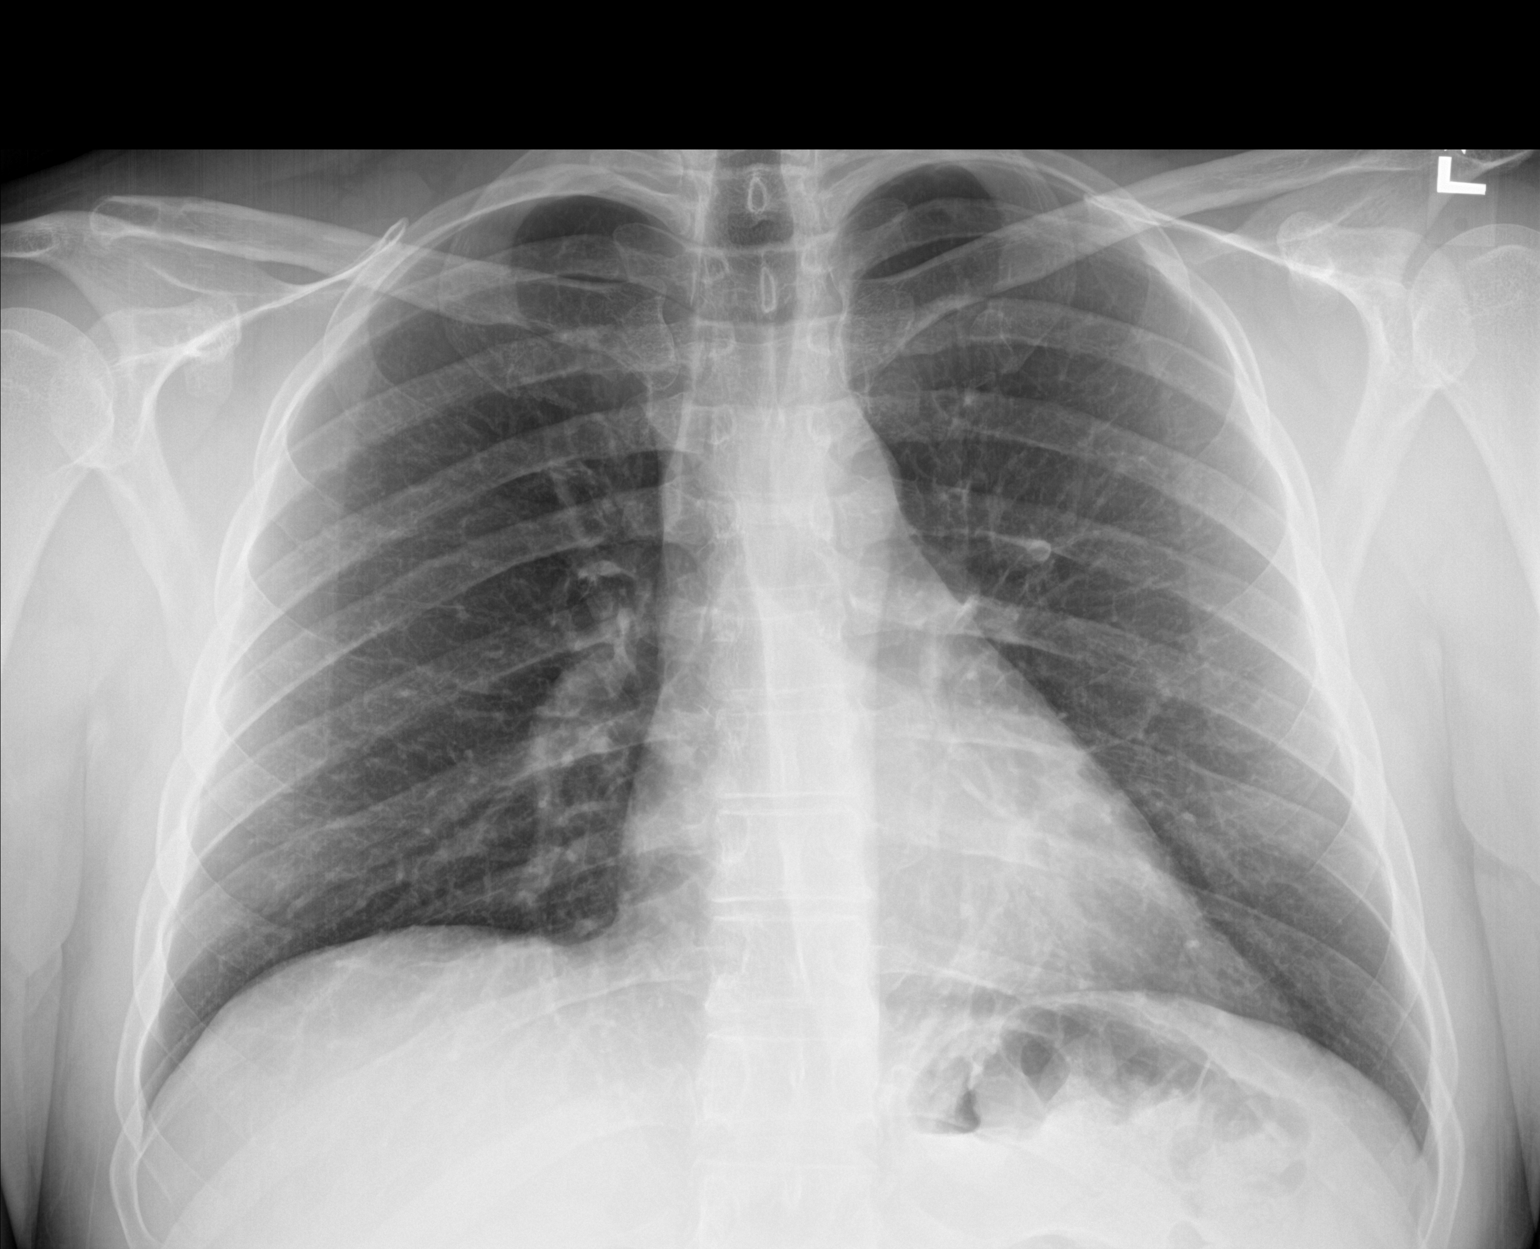

[chest lat]
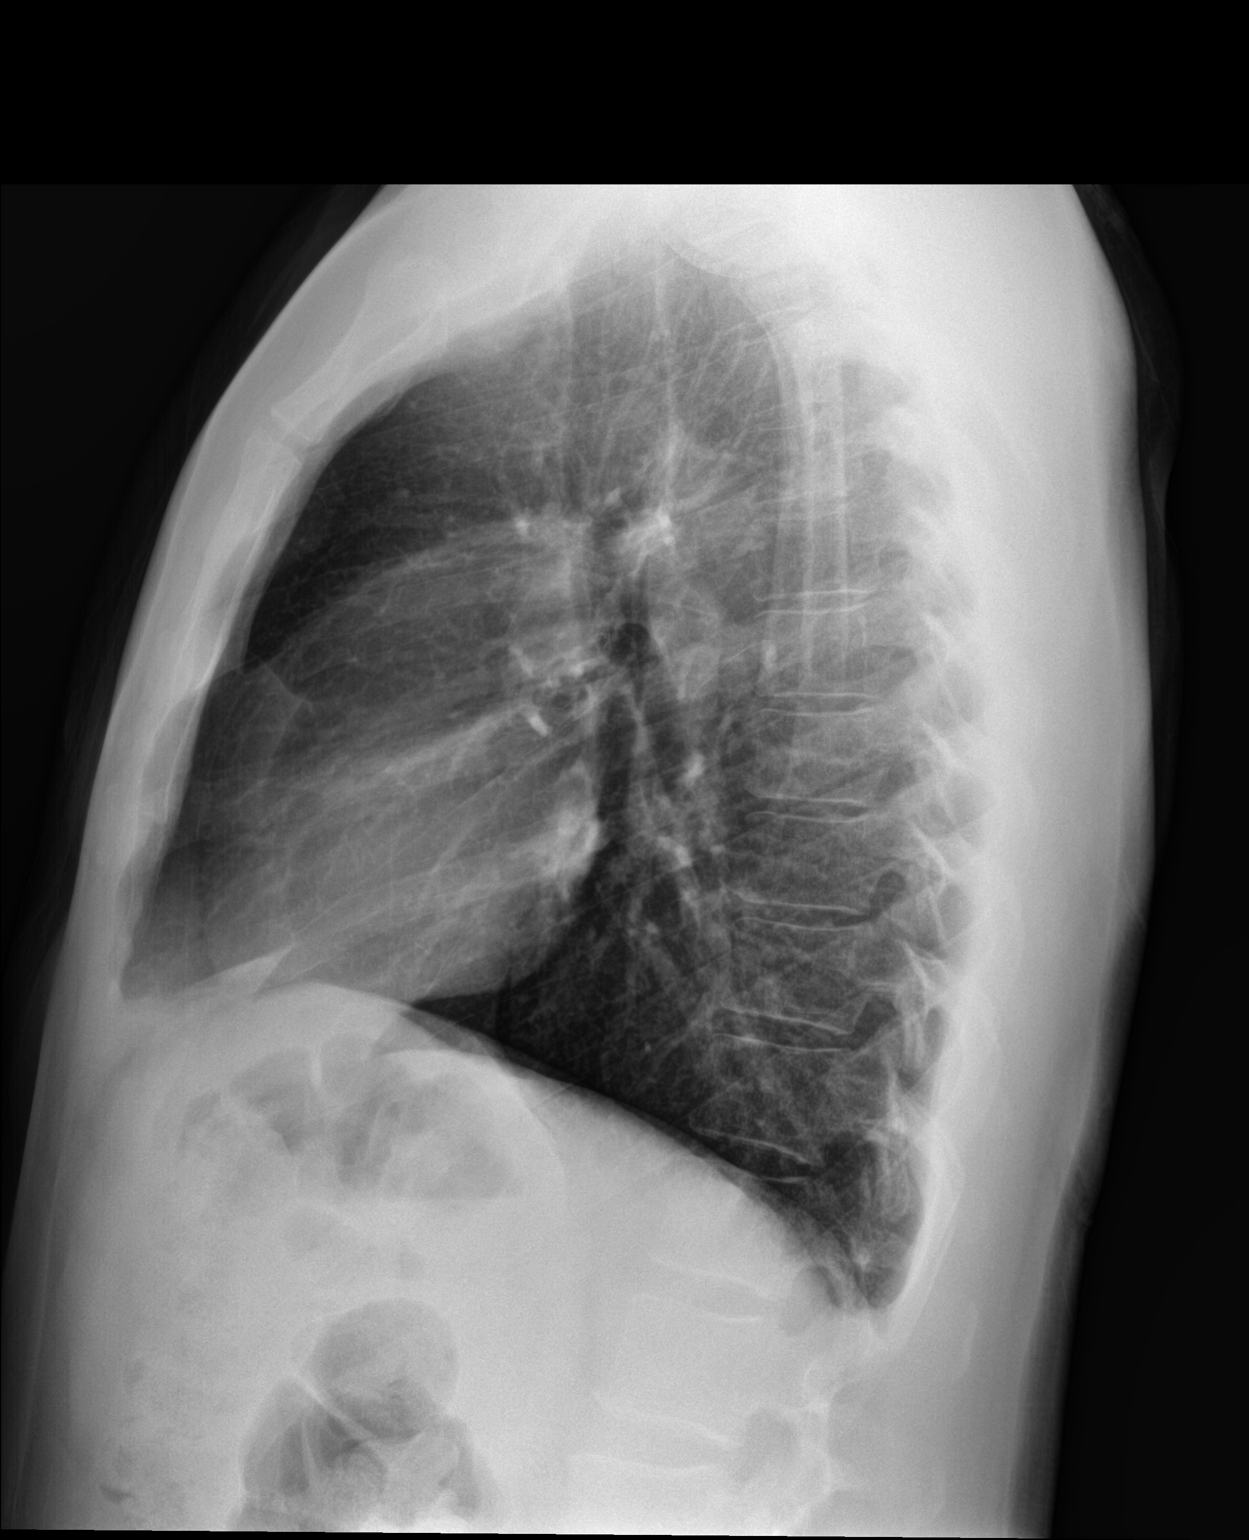

[2 of 2 positions shown; findings below may reference images not displayed]

FINDINGS: Mediastinum hilar structures normal. Lungs are clear. Patch that
mild left base subsegmental atelectasis and infiltrate. No pleural
effusion or pneumothorax. Heart size normal. No acute bony
abnormality .
IMPRESSION: Mild left base subsegmental atelectasis and infiltrate.

## 2019-01-13 ENCOUNTER — Encounter: Payer: Self-pay | Admitting: Adult Health Nurse Practitioner

## 2019-01-13 ENCOUNTER — Telehealth (INDEPENDENT_AMBULATORY_CARE_PROVIDER_SITE_OTHER): Payer: Federal, State, Local not specified - PPO | Admitting: Adult Health Nurse Practitioner

## 2019-01-13 ENCOUNTER — Other Ambulatory Visit: Payer: Self-pay

## 2019-01-13 DIAGNOSIS — J069 Acute upper respiratory infection, unspecified: Secondary | ICD-10-CM

## 2019-01-13 HISTORY — DX: Acute upper respiratory infection, unspecified: J06.9

## 2019-01-13 MED ORDER — AZITHROMYCIN 250 MG PO TABS: ORAL_TABLET | ORAL | 0 refills | Status: AC

## 2019-01-13 NOTE — Progress Notes (Signed)
Telemedicine Encounter- SOAP NOTE Established Patient  This telephone encounter was conducted with the patient's (or proxy's) verbal consent via audio telecommunications: yes/no: Yes Patient was instructed to have this encounter in a suitably private space; and to only have persons present to whom they give permission to participate. In addition, patient identity was confirmed by use of name plus two identifiers (DOB and address).  I discussed the limitations, risks, security and privacy concerns of performing an evaluation and management service by telephone and the availability of in person appointments. I also discussed with the patient that there may be a patient responsible charge related to this service. The patient expressed understanding and agreed to proceed.  I spent a total of TIME; 0 MIN TO 60 MIN: 15 minutes talking with the patient or their proxy.  Chief Complaint  Patient presents with  . Pneumonia    Pt stated coughing dark yellow mucus,  congestion but no fever--2 weeks. Taking mucinex.    Subjective   Carlos Bush is a 43 y.o. established patient. Telephone visit today for Upper respiratory symptoms x 2 weeks   Upper Respiratory Infection: Patient complains of symptoms of a URI. Symptoms include congestion. Onset of symptoms was 14 days ago, gradually worsening since that time. He also c/o congestion and cough described as productive of gray sputum for the past 14 days .  He is drinking plenty of fluids. Evaluation to date: none. Treatment to date: decongestants.    Pneumonia He complains of cough and sputum production. Associated symptoms include malaise/fatigue. Pertinent negatives include no fever or headaches.     Patient Active Problem List   Diagnosis Date Noted  . Acute upper respiratory infection 01/13/2019  . Herpes zoster without complication 06/27/2017  . Myopia-contacts 09/14/2013    Past Medical History:  Diagnosis Date  . Acute upper  respiratory infection 01/13/2019    Current Outpatient Medications  Medication Sig Dispense Refill  . cetirizine (ZYRTEC ALLERGY) 10 MG tablet Take 10 mg by mouth daily.    . Guaifenesin (MUCINEX MAXIMUM STRENGTH) 1200 MG TB12 Take by mouth.    . Ibuprofen (ADVIL PO) Take by mouth. Taking 2-3 daily.    . Multiple Vitamin (MULTIVITAMIN) capsule Take 1 capsule by mouth daily.     No current facility-administered medications for this visit.     No Known Allergies  Social History   Socioeconomic History  . Marital status: Single    Spouse name: Not on file  . Number of children: Not on file  . Years of education: Not on file  . Highest education level: Not on file  Occupational History  . Not on file  Social Needs  . Financial resource strain: Not on file  . Food insecurity    Worry: Not on file    Inability: Not on file  . Transportation needs    Medical: Not on file    Non-medical: Not on file  Tobacco Use  . Smoking status: Never Smoker  . Smokeless tobacco: Never Used  Substance and Sexual Activity  . Alcohol use: No    Alcohol/week: 0.0 standard drinks  . Drug use: No  . Sexual activity: Yes  Lifestyle  . Physical activity    Days per week: Not on file    Minutes per session: Not on file  . Stress: Not on file  Relationships  . Social Musician on phone: Not on file    Gets together: Not on file  Attends religious service: Not on file    Active member of club or organization: Not on file    Attends meetings of clubs or organizations: Not on file    Relationship status: Not on file  . Intimate partner violence    Fear of current or ex partner: Not on file    Emotionally abused: Not on file    Physically abused: Not on file    Forced sexual activity: Not on file  Other Topics Concern  . Not on file  Social History Narrative  . Not on file    Review of Systems  Constitutional: Positive for malaise/fatigue. Negative for chills and fever.   HENT: Positive for congestion. Negative for sinus pain.   Eyes: Negative.   Respiratory: Positive for cough and sputum production.   Neurological: Negative for dizziness and headaches.  Endo/Heme/Allergies: Positive for environmental allergies.    Objective   Vitals as reported by the patient: Today's Vitals   01/13/19 0832  Weight: 185 lb (83.9 kg)  Height: 5\' 10"  (1.778 m)    GEN:  NAD, Non-toxic, Alert & Oriented x  PSYCH: Normally interactive. Conversant. Not depressed or anxious appearing.  Calm demeanor.   Carlos Bush was seen today for pneumonia.  Diagnoses and all orders for this visit:  Acute upper respiratory infection     I discussed the assessment and treatment plan with the patient. The patient was provided an opportunity to ask questions and all were answered. The patient agreed with the plan and demonstrated an understanding of the instructions.   The patient was advised to call back or seek an in-person evaluation if the symptoms worsen or if the condition fails to improve as anticipated.  I provided 15 minutes of non-face-to-face time during this encounter.  Glyn Ade, NP  Primary Care at [facility]

## 2020-08-17 ENCOUNTER — Other Ambulatory Visit: Payer: Self-pay

## 2020-08-17 ENCOUNTER — Encounter (HOSPITAL_COMMUNITY): Payer: Self-pay

## 2020-08-17 ENCOUNTER — Ambulatory Visit (HOSPITAL_COMMUNITY)
Admission: EM | Admit: 2020-08-17 | Discharge: 2020-08-17 | Disposition: A | Discharge status: Home / Self Care | Payer: Federal, State, Local not specified - PPO | Attending: Emergency Medicine | Admitting: Emergency Medicine

## 2020-08-17 DIAGNOSIS — J0110 Acute frontal sinusitis, unspecified: Secondary | ICD-10-CM

## 2020-08-17 MED ORDER — AMOXICILLIN-POT CLAVULANATE 875-125 MG PO TABS: 1.0000 | ORAL_TABLET | Freq: Two times a day (BID) | ORAL | 0 refills | Status: AC

## 2020-08-17 NOTE — Discharge Instructions (Addendum)
Take the Augmentin twice a day for the next 7 days.    You can continue to take the over-the-counter allergy and decongestant medications.  You can also take Flonase 2 sprays in each nostril daily.   You can take Tylenol and/or Ibuprofen as needed for fever reduction and pain relief.    For cough: honey 1/2 to 1 teaspoon (you can dilute the honey in water or another fluid).  You can also use guaifenesin and dextromethorphan for cough. You can use a humidifier for chest congestion and cough.  If you don't have a humidifier, you can sit in the bathroom with the hot shower running.     For sore throat: try warm salt water gargles, cepacol lozenges, throat spray, warm tea or water with lemon/honey, popsicles or ice, or OTC cold relief medicine for throat discomfort.    For congestion: take a daily anti-histamine like Zyrtec, Claritin, and a oral decongestant, such as pseudoephedrine.  You can also use Flonase 1-2 sprays in each nostril daily.    It is important to stay hydrated: drink plenty of fluids (water, gatorade/powerade/pedialyte, juices, or teas) to keep your throat moisturized and help further relieve irritation/discomfort.   Return or go to the Emergency Department if symptoms worsen or do not improve in the next few days.

## 2020-08-17 NOTE — ED Triage Notes (Signed)
Pt in with c/o sinus pressure, congestion and productive cough x 7 days  Pt has been taking mucinex and sudafed for relief

## 2020-08-17 NOTE — ED Provider Notes (Signed)
[facility]    CSN: [id number] Arrival date & time: 08/17/20  1827      History   Chief Complaint Chief Complaint  Patient presents with  . Cough  . Nasal Congestion  . Facial Pain    HPI Carlos Bush is a 45 y.o. male.   Patient here for evaluation of sinus pain and pressure that has been ongoing for the past 7 to 8 days.  Reports symptoms initially started to improve but have gotten worse over the past 2 to 3 days.  Reports taking Mucinex and Sudafed as well as an allergy medication with minimal relief.  Denies any trauma, injury, or other precipitating event.  Denies any specific alleviating or aggravating factors.  Denies any fevers, chest pain, shortness of breath, N/V/D, numbness, tingling, weakness, abdominal pain.    The history is provided by the patient.    Past Medical History:  Diagnosis Date  . Acute upper respiratory infection 01/13/2019    Patient Active Problem List   Diagnosis Date Noted  . Acute upper respiratory infection 01/13/2019  . Herpes zoster without complication 06/27/2017  . Myopia-contacts 09/14/2013    History reviewed. No pertinent surgical history.     Home Medications    Prior to Admission medications   Medication Sig Start Date End Date Taking? Authorizing Provider  amoxicillin-clavulanate (AUGMENTIN) 875-125 MG tablet Take 1 tablet by mouth every 12 (twelve) hours for 7 days. 08/17/20 08/24/20 Yes Ivette Loyal, NP  azithromycin (ZITHROMAX) 250 MG tablet As directed on pkg label 01/13/19   Royal Hawthorn, NP  cetirizine (ZYRTEC ALLERGY) 10 MG tablet Take 10 mg by mouth daily.    [provider]  Guaifenesin (MUCINEX MAXIMUM STRENGTH) 1200 MG TB12 Take by mouth.    [provider]  Ibuprofen (ADVIL PO) Take by mouth. Taking 2-3 daily.    [provider]  Multiple Vitamin (MULTIVITAMIN) capsule Take 1 capsule by mouth daily.    [provider]    Family History Family History   Problem Relation Age of Onset  . Cirrhosis Father     Social History Social History   Tobacco Use  . Smoking status: Never Smoker  . Smokeless tobacco: Never Used  Substance Use Topics  . Alcohol use: No    Alcohol/week: 0.0 standard drinks  . Drug use: No     Allergies   Patient has no known allergies.   Review of Systems Review of Systems  HENT: Positive for congestion, sinus pressure and sinus pain.   All other systems reviewed and are negative.    Physical Exam Triage Vital Signs ED Triage Vitals  Enc Vitals Group     BP 08/17/20 1841 129/82     Pulse Rate 08/17/20 1841 87     Resp 08/17/20 1841 18     Temp 08/17/20 1841 99.5 F (37.5 C)     Temp Source 08/17/20 1841 Oral     SpO2 08/17/20 1841 97 %     Weight --      Height --      Head Circumference --      Peak Flow --      Pain Score 08/17/20 1840 7     Pain Loc --      Pain Edu? --      Excl. in GC? --    No data found.  Updated Vital Signs BP 129/82 (BP Location: Left Arm)   Pulse 87   Temp 99.5  F (37.5 C) (Oral)   Resp 18   SpO2 97%   Visual Acuity Right Eye Distance:   Left Eye Distance:   Bilateral Distance:    Right Eye Near:   Left Eye Near:    Bilateral Near:     Physical Exam Vitals and nursing note reviewed.  Constitutional:      General: He is not in acute distress.    Appearance: Normal appearance. He is not ill-appearing, toxic-appearing or diaphoretic.  HENT:     Head: Normocephalic and atraumatic.     Nose: Congestion present. No nasal tenderness.     Right Sinus: Frontal sinus tenderness present. No maxillary sinus tenderness.     Left Sinus: Frontal sinus tenderness present. No maxillary sinus tenderness.     Mouth/Throat:     Pharynx: Uvula midline. Posterior oropharyngeal erythema present. No pharyngeal swelling, oropharyngeal exudate or uvula swelling.     Tonsils: No tonsillar exudate or tonsillar abscesses.  Eyes:     Conjunctiva/sclera: Conjunctivae  normal.  Cardiovascular:     Rate and Rhythm: Normal rate.     Pulses: Normal pulses.  Pulmonary:     Effort: Pulmonary effort is normal.  Abdominal:     General: Abdomen is flat.  Musculoskeletal:        General: Normal range of motion.     Cervical back: Normal range of motion.  Skin:    General: Skin is warm and dry.  Neurological:     General: No focal deficit present.     Mental Status: He is alert and oriented to person, place, and time.  Psychiatric:        Mood and Affect: Mood normal.      UC Treatments / Results  Labs (all labs ordered are listed, but only abnormal results are displayed) Labs Reviewed - No data to display  EKG   Radiology No results found.  Procedures Procedures (including critical care time)  Medications Ordered in UC Medications - No data to display  Initial Impression / Assessment and Plan / UC Course  I have reviewed the triage vital signs and the nursing notes.  Pertinent labs & imaging results that were available during my care of the patient were reviewed by me and considered in my medical decision making (see chart for details).    Assessment negative for red flags or concerns.  Likely sinusitis.  Will treat with Augmentin twice daily for the next 7 days.  Continue OTC medications and decongestants.  Recommend adding Flonase 2 sprays in each nostril daily.  May take Tylenol and/or ibuprofen as needed for fever and pain.  Discussed conservative symptom management as described in discharge instructions.  Follow-up with primary care as needed.  Final Clinical Impressions(s) / UC Diagnoses   Final diagnoses:  Acute non-recurrent frontal sinusitis     Discharge Instructions     Take the Augmentin twice a day for the next 7 days.    You can continue to take the over-the-counter allergy and decongestant medications.  You can also take Flonase 2 sprays in each nostril daily.   You can take Tylenol and/or Ibuprofen as needed for  fever reduction and pain relief.    For cough: honey 1/2 to 1 teaspoon (you can dilute the honey in water or another fluid).  You can also use guaifenesin and dextromethorphan for cough. You can use a humidifier for chest congestion and cough.  If you don't have a humidifier, you can sit in the bathroom  with the hot shower running.     For sore throat: try warm salt water gargles, cepacol lozenges, throat spray, warm tea or water with lemon/honey, popsicles or ice, or OTC cold relief medicine for throat discomfort.    For congestion: take a daily anti-histamine like Zyrtec, Claritin, and a oral decongestant, such as pseudoephedrine.  You can also use Flonase 1-2 sprays in each nostril daily.    It is important to stay hydrated: drink plenty of fluids (water, gatorade/powerade/pedialyte, juices, or teas) to keep your throat moisturized and help further relieve irritation/discomfort.   Return or go to the Emergency Department if symptoms worsen or do not improve in the next few days.     ED Prescriptions    Medication Sig Dispense Auth. Provider   amoxicillin-clavulanate (AUGMENTIN) 875-125 MG tablet Take 1 tablet by mouth every 12 (twelve) hours for 7 days. 14 tablet Ivette Loyal, NP     PDMP not reviewed this encounter.   Ivette Loyal, NP 08/17/20 1901

## 2021-08-10 ENCOUNTER — Ambulatory Visit
Admission: EM | Admit: 2021-08-10 | Discharge: 2021-08-10 | Disposition: A | Discharge status: Home / Self Care | Payer: Federal, State, Local not specified - PPO | Attending: Urgent Care | Admitting: Urgent Care

## 2021-08-10 DIAGNOSIS — M545 Low back pain, unspecified: Secondary | ICD-10-CM

## 2021-08-10 MED ORDER — TIZANIDINE HCL 4 MG PO TABS: 4.0000 mg | ORAL_TABLET | Freq: Every day | ORAL | 0 refills | Status: AC

## 2021-08-10 MED ORDER — NAPROXEN 500 MG PO TABS: 500.0000 mg | ORAL_TABLET | Freq: Two times a day (BID) | ORAL | 0 refills | Status: AC

## 2021-08-10 NOTE — ED Provider Notes (Signed)
[facility]   MRN: [id number] DOB: [birth year]  Subjective:   Carlos Bush is a 46 y.o. male presenting for 5-day history of acute onset persistent moderate low back pain mostly over the right side but does feel it on the left 2.  No fall, trauma, weakness, numbness or tingling, saddle paresthesia, hematuria, dysuria.  No changes to bowel or urinary habits.  Tried Advil this morning.  Works for [employer] but does not have to do a lot of heavy lifting or strenuous physical activities.  No current facility-administered medications for this encounter.  Current Outpatient Medications:    azithromycin (ZITHROMAX) 250 MG tablet, As directed on pkg label, Disp: 6 tablet, Rfl: 0   cetirizine (ZYRTEC ALLERGY) 10 MG tablet, Take 10 mg by mouth daily., Disp: , Rfl:    Guaifenesin (MUCINEX MAXIMUM STRENGTH) 1200 MG TB12, Take by mouth., Disp: , Rfl:    Ibuprofen (ADVIL PO), Take by mouth. Taking 2-3 daily., Disp: , Rfl:    Multiple Vitamin (MULTIVITAMIN) capsule, Take 1 capsule by mouth daily., Disp: , Rfl:    No Known Allergies  Past Medical History:  Diagnosis Date   Acute upper respiratory infection 01/13/2019     History reviewed. No pertinent surgical history.  Family History  Problem Relation Age of Onset   Cirrhosis Father     Social History   Tobacco Use   Smoking status: Never   Smokeless tobacco: Never  Substance Use Topics   Alcohol use: No    Alcohol/week: 0.0 standard drinks   Drug use: No    ROS   Objective:   Vitals: BP 124/83 (BP Location: Left Arm)   Pulse 71   Temp 98.5 F (36.9 C) (Oral)   Resp 18   SpO2 95%   Physical Exam Constitutional:      General: He is not in acute distress.    Appearance: Normal appearance. He is well-developed and normal weight. He is not ill-appearing, toxic-appearing or diaphoretic.  HENT:     Head: Normocephalic and atraumatic.     Right Ear: External ear normal.     Left Ear: External ear normal.      Nose: Nose normal.     Mouth/Throat:     Pharynx: Oropharynx is clear.  Eyes:     General: No scleral icterus.       Right eye: No discharge.        Left eye: No discharge.     Extraocular Movements: Extraocular movements intact.  Cardiovascular:     Rate and Rhythm: Normal rate.  Pulmonary:     Effort: Pulmonary effort is normal.  Musculoskeletal:     Cervical back: Normal range of motion.     Lumbar back: Tenderness (very mild over paraspinal muscles mostly over the right) present. No swelling, edema, deformity, signs of trauma, lacerations, spasms or bony tenderness. Normal range of motion. Negative right straight leg raise test and negative left straight leg raise test. No scoliosis.  Neurological:     Mental Status: He is alert and oriented to person, place, and time.  Psychiatric:        Mood and Affect: Mood normal.        Behavior: Behavior normal.        Thought Content: Thought content normal.        Judgment: Judgment normal.    Assessment and Plan :   PDMP not reviewed this encounter.  1. Acute bilateral low back pain without  sciatica    Will manage conservatively for back pain that is likely musculoskeletal in nature as patient has no radicular symptoms.  Recommended naproxen, tizanidine, rest and modification of physical activity.  Anticipatory guidance provided.  Discussed back care.  Counseled patient on potential for adverse effects with medications prescribed/recommended today, ER and return-to-clinic precautions discussed, patient verbalized understanding.    Jaynee Eagles, [place] 08/10/21 [phone]

## 2021-08-10 NOTE — ED Triage Notes (Signed)
Pt c/o back pain (more to the right lower back), the patient states he does not recall doing anything to cause back pain. Started: 5 days ago  Home interventions: advil this morning

## 2023-05-02 ENCOUNTER — Ambulatory Visit
Admission: RE | Admit: 2023-05-02 | Discharge: 2023-05-02 | Disposition: A | Discharge status: Home / Self Care | Payer: Federal, State, Local not specified - PPO | Source: Ambulatory Visit | Attending: Family Medicine | Admitting: Family Medicine

## 2023-05-02 ENCOUNTER — Ambulatory Visit (HOSPITAL_BASED_OUTPATIENT_CLINIC_OR_DEPARTMENT_OTHER)
Admission: RE | Admit: 2023-05-02 | Discharge: 2023-05-02 | Disposition: A | Discharge status: Home / Self Care | Payer: Federal, State, Local not specified - PPO | Source: Ambulatory Visit | Attending: Urgent Care | Admitting: Urgent Care

## 2023-05-02 VITALS — BP 135/91 | HR 74 | Temp 97.9°F | Resp 16

## 2023-05-02 DIAGNOSIS — M25512 Pain in left shoulder: Secondary | ICD-10-CM

## 2023-05-02 MED ORDER — PREDNISONE 10 MG PO TABS: 30.0000 mg | ORAL_TABLET | Freq: Every day | ORAL | 0 refills | Status: DC

## 2023-05-02 MED ORDER — CYCLOBENZAPRINE HCL 5 MG PO TABS: 5.0000 mg | ORAL_TABLET | Freq: Every evening | ORAL | 0 refills | Status: AC | PRN

## 2023-05-02 NOTE — ED Triage Notes (Signed)
Pt c/o left shoulder pain x 4 days-pain worse with movement/denies injury-states ~2 weeks ago he had pain to shoulder and scapula area-taking napoxen and ibuprofen with some relief-NAD-steady gait

## 2023-05-02 NOTE — ED Provider Notes (Signed)
[facility]  Note:  This document was prepared using Conservation officer, historic buildings and may include unintentional dictation errors.  MRN: [id number] DOB: 05-21-1975  Subjective:   Carlos Bush is a 48 y.o. male presenting for 4 day history of acute onset persistent left shoulder pain.  No fall, trauma, obvious injury.  Patient reports that initially the pain was posterior lateral but now is anterior-medial.  Has previously had right shoulder separation that required intervention with an orthopedist.  Heavy lifting, overexertion of the arm.  Has been taking naproxen and ibuprofen with temporary relief.  No current facility-administered medications for this encounter.  Current Outpatient Medications:    azithromycin (ZITHROMAX) 250 MG tablet, As directed on pkg label, Disp: 6 tablet, Rfl: 0   cetirizine (ZYRTEC ALLERGY) 10 MG tablet, Take 10 mg by mouth daily., Disp: , Rfl:    Guaifenesin (MUCINEX MAXIMUM STRENGTH) 1200 MG TB12, Take by mouth., Disp: , Rfl:    Ibuprofen (ADVIL PO), Take by mouth. Taking 2-3 daily., Disp: , Rfl:    Multiple Vitamin (MULTIVITAMIN) capsule, Take 1 capsule by mouth daily., Disp: , Rfl:    naproxen (NAPROSYN) 500 MG tablet, Take 1 tablet (500 mg total) by mouth 2 (two) times daily with a meal., Disp: 30 tablet, Rfl: 0   tiZANidine (ZANAFLEX) 4 MG tablet, Take 1 tablet (4 mg total) by mouth at bedtime., Disp: 30 tablet, Rfl: 0   No Known Allergies  Past Medical History:  Diagnosis Date   Acute upper respiratory infection 01/13/2019     History reviewed. No pertinent surgical history.  Family History  Problem Relation Age of Onset   Cirrhosis Father     Social History   Tobacco Use   Smoking status: Never   Smokeless tobacco: Never  Vaping Use   Vaping status: Never Used  Substance Use Topics   Alcohol use: No    Alcohol/week: 0.0 standard drinks of alcohol   Drug use: No    ROS   Objective:   Vitals: BP (!)  135/91 (BP Location: Right Arm)   Pulse 74   Temp 97.9 F (36.6 C) (Oral)   Resp 16   SpO2 95%   Physical Exam Constitutional:      General: He is not in acute distress.    Appearance: Normal appearance. He is well-developed and normal weight. He is not ill-appearing, toxic-appearing or diaphoretic.  HENT:     Head: Normocephalic and atraumatic.     Right Ear: External ear normal.     Left Ear: External ear normal.     Nose: Nose normal.     Mouth/Throat:     Pharynx: Oropharynx is clear.  Eyes:     General: No scleral icterus.       Right eye: No discharge.        Left eye: No discharge.     Extraocular Movements: Extraocular movements intact.  Cardiovascular:     Rate and Rhythm: Normal rate.  Pulmonary:     Effort: Pulmonary effort is normal.  Musculoskeletal:     Left shoulder: No swelling, deformity, effusion, laceration, tenderness, bony tenderness or crepitus. Normal range of motion. Normal strength.     Cervical back: Normal range of motion.  Neurological:     Mental Status: He is alert and oriented to person, place, and time.  Psychiatric:        Mood and Affect: Mood normal.        Behavior: Behavior  normal.        Thought Content: Thought content normal.        Judgment: Judgment normal.     Assessment and Plan :   PDMP not reviewed this encounter.  1. Acute pain of left shoulder    Will pursue outpatient x-ray.  As he is failing to achieve resolution with NSAIDs, recommended prednisone and a muscle relaxant.  Follow-up with an orthopedist especially if symptoms persist.  Counseled patient on potential for adverse effects with medications prescribed/recommended today, ER and return-to-clinic precautions discussed, patient verbalized understanding.    Wallis Bamberg, PA-C 05/02/23 1430

## 2024-02-17 ENCOUNTER — Ambulatory Visit (INDEPENDENT_AMBULATORY_CARE_PROVIDER_SITE_OTHER)

## 2024-02-17 ENCOUNTER — Ambulatory Visit
Admission: RE | Admit: 2024-02-17 | Discharge: 2024-02-17 | Disposition: A | Discharge status: Home / Self Care | Source: Ambulatory Visit | Attending: Family Medicine | Admitting: Family Medicine

## 2024-02-17 VITALS — BP 151/94 | HR 71 | Temp 98.5°F | Resp 16

## 2024-02-17 DIAGNOSIS — M79674 Pain in right toe(s): Secondary | ICD-10-CM

## 2024-02-17 MED ORDER — PREDNISONE 10 MG PO TABS: 30.0000 mg | ORAL_TABLET | Freq: Every day | ORAL | 0 refills | Status: AC

## 2024-02-17 NOTE — ED Provider Notes (Signed)
 [facility]  Note:  This document was prepared using Conservation officer, historic buildings and may include unintentional dictation errors.  MRN: [id number] DOB: [birth year]  Subjective:   TERUO STILLEY is a 48 y.o. male presenting for wound 6-day history of persistent right great toe, distal foot pain with trace swelling.  No fall, trauma.  No rashes, open wounds.  Patient has used ibuprofen with some relief but is not achieving resolution of his symptoms and still has moderate pain.  No history of gout.  Does not drink alcohol.  No history of fractures or injuries to the area.  No current facility-administered medications for this encounter.  Current Outpatient Medications:    azithromycin  (ZITHROMAX ) 250 MG tablet, As directed on pkg label, Disp: 6 tablet, Rfl: 0   cetirizine  (ZYRTEC  ALLERGY) 10 MG tablet, Take 10 mg by mouth daily., Disp: , Rfl:    cyclobenzaprine  (FLEXERIL ) 5 MG tablet, Take 1 tablet (5 mg total) by mouth at bedtime as needed., Disp: 30 tablet, Rfl: 0   Guaifenesin  (MUCINEX  MAXIMUM STRENGTH) 1200 MG TB12, Take by mouth., Disp: , Rfl:    Ibuprofen (ADVIL PO), Take by mouth. Taking 2-3 daily., Disp: , Rfl:    Multiple Vitamin (MULTIVITAMIN) capsule, Take 1 capsule by mouth daily., Disp: , Rfl:    naproxen  (NAPROSYN ) 500 MG tablet, Take 1 tablet (500 mg total) by mouth 2 (two) times daily with a meal., Disp: 30 tablet, Rfl: 0   predniSONE  (DELTASONE ) 10 MG tablet, Take 3 tablets (30 mg total) by mouth daily with breakfast., Disp: 15 tablet, Rfl: 0   tiZANidine  (ZANAFLEX ) 4 MG tablet, Take 1 tablet (4 mg total) by mouth at bedtime., Disp: 30 tablet, Rfl: 0   No Known Allergies  Past Medical History:  Diagnosis Date   Acute upper respiratory infection 01/13/2019     History reviewed. No pertinent surgical history.  Family History  Problem Relation Age of Onset   Cirrhosis Father     Social History   Tobacco Use   Smoking status: Never    Smokeless tobacco: Never  Vaping Use   Vaping status: Never Used  Substance Use Topics   Alcohol use: No    Alcohol/week: 0.0 standard drinks of alcohol   Drug use: No    ROS   Objective:   Vitals: BP (!) 151/94 (BP Location: Right Arm)   Pulse 71   Temp 98.5 F (36.9 C) (Oral)   Resp 16   SpO2 97%   Physical Exam Constitutional:      General: He is not in acute distress.    Appearance: Normal appearance. He is well-developed and normal weight. He is not ill-appearing, toxic-appearing or diaphoretic.  HENT:     Head: Normocephalic and atraumatic.     Right Ear: External ear normal.     Left Ear: External ear normal.     Nose: Nose normal.     Mouth/Throat:     Pharynx: Oropharynx is clear.  Eyes:     General: No scleral icterus.       Right eye: No discharge.        Left eye: No discharge.     Extraocular Movements: Extraocular movements intact.  Cardiovascular:     Rate and Rhythm: Normal rate.  Pulmonary:     Effort: Pulmonary effort is normal.  Musculoskeletal:     Cervical back: Normal range of motion.     Right foot: Normal range of motion and normal  capillary refill. Swelling (trace), tenderness (area outlined, more prominent with dorsiflexion) and bony tenderness present. No deformity, laceration or crepitus.       Feet:  Neurological:     Mental Status: He is alert and oriented to person, place, and time.  Psychiatric:        Mood and Affect: Mood normal.        Behavior: Behavior normal.        Thought Content: Thought content normal.        Judgment: Judgment normal.     Assessment and Plan :   PDMP not reviewed this encounter.  1. Great toe pain, right    X-ray over-read was pending at time of discharge, recommended follow up with only abnormal results. Otherwise will not call for negative over-read. Patient was in agreement.  Patient is not experiencing enough resolution of his symptoms with ibuprofen.  Suspect his symptoms are inflammatory  in nature, possibly related to overuse.  Requested prednisone  as he previously did very well with this.  Offered a course of 30 mg for 5 days.  Counseled patient on potential for adverse effects with medications prescribed/recommended today, ER and return-to-clinic precautions discussed, patient verbalized understanding.    Christopher Savannah, [place] 02/17/24 1928

## 2024-02-17 NOTE — ED Triage Notes (Signed)
 Pt c/o pain to right great toe x 6 days-denies injury-taking ibuprofen with some relief-NAD-steady gait

## 2024-02-20 ENCOUNTER — Ambulatory Visit: Payer: Self-pay | Admitting: Urgent Care
